# Patient Record
Sex: Female | Born: 1960 | Race: Black or African American | Hispanic: No | Smoking: Former smoker
Health system: Southern US, Community
[De-identification: ages and names within clinical notes are randomized; demographics above are authoritative.]

---

## 1998-03-10 ENCOUNTER — Encounter: Admission: RE | Admit: 1998-03-10 | Discharge: 1998-03-10 | Payer: Self-pay | Admitting: Family Medicine

## 1998-03-26 ENCOUNTER — Encounter: Admission: RE | Admit: 1998-03-26 | Discharge: 1998-03-26 | Payer: Self-pay | Admitting: Family Medicine

## 2001-02-07 ENCOUNTER — Encounter: Admission: RE | Admit: 2001-02-07 | Discharge: 2001-02-07 | Payer: Self-pay | Admitting: Family Medicine

## 2001-02-14 ENCOUNTER — Encounter: Admission: RE | Admit: 2001-02-14 | Discharge: 2001-02-14 | Payer: Self-pay | Admitting: Family Medicine

## 2001-04-12 ENCOUNTER — Encounter: Admission: RE | Admit: 2001-04-12 | Discharge: 2001-04-12 | Payer: Self-pay | Admitting: Family Medicine

## 2001-06-19 ENCOUNTER — Encounter: Admission: RE | Admit: 2001-06-19 | Discharge: 2001-06-19 | Payer: Self-pay | Admitting: Family Medicine

## 2002-03-21 ENCOUNTER — Encounter (INDEPENDENT_AMBULATORY_CARE_PROVIDER_SITE_OTHER): Payer: Self-pay | Admitting: *Deleted

## 2002-04-11 ENCOUNTER — Ambulatory Visit (HOSPITAL_COMMUNITY): Admission: RE | Admit: 2002-04-11 | Discharge: 2002-04-11 | Payer: Self-pay | Admitting: Obstetrics and Gynecology

## 2002-08-28 ENCOUNTER — Encounter: Admission: RE | Admit: 2002-08-28 | Discharge: 2002-08-28 | Payer: Self-pay | Admitting: Sports Medicine

## 2002-10-03 ENCOUNTER — Encounter: Admission: RE | Admit: 2002-10-03 | Discharge: 2002-10-03 | Payer: Self-pay | Admitting: Family Medicine

## 2002-10-25 ENCOUNTER — Inpatient Hospital Stay (HOSPITAL_COMMUNITY): Admission: EM | Admit: 2002-10-25 | Discharge: 2002-10-28 | Payer: Self-pay | Admitting: Emergency Medicine

## 2002-10-25 ENCOUNTER — Encounter: Payer: Self-pay | Admitting: Emergency Medicine

## 2005-09-20 ENCOUNTER — Ambulatory Visit: Payer: Self-pay | Admitting: Family Medicine

## 2006-10-18 DIAGNOSIS — L2089 Other atopic dermatitis: Secondary | ICD-10-CM

## 2006-10-19 ENCOUNTER — Encounter (INDEPENDENT_AMBULATORY_CARE_PROVIDER_SITE_OTHER): Payer: Self-pay | Admitting: *Deleted

## 2006-10-23 ENCOUNTER — Ambulatory Visit: Payer: Self-pay | Admitting: Family Medicine

## 2006-10-23 ENCOUNTER — Encounter (INDEPENDENT_AMBULATORY_CARE_PROVIDER_SITE_OTHER): Payer: Self-pay | Admitting: Family Medicine

## 2006-10-23 DIAGNOSIS — I252 Old myocardial infarction: Secondary | ICD-10-CM | POA: Insufficient documentation

## 2006-10-23 DIAGNOSIS — F17200 Nicotine dependence, unspecified, uncomplicated: Secondary | ICD-10-CM

## 2006-10-23 DIAGNOSIS — N924 Excessive bleeding in the premenopausal period: Secondary | ICD-10-CM

## 2006-10-23 LAB — CONVERTED CEMR LAB: TSH: 2.39 microintl units/mL (ref 0.350–5.50)

## 2006-10-25 ENCOUNTER — Ambulatory Visit (HOSPITAL_COMMUNITY): Admission: RE | Admit: 2006-10-25 | Discharge: 2006-10-25 | Payer: Self-pay | Admitting: Family Medicine

## 2006-11-09 ENCOUNTER — Encounter (INDEPENDENT_AMBULATORY_CARE_PROVIDER_SITE_OTHER): Payer: Self-pay | Admitting: Family Medicine

## 2006-11-28 ENCOUNTER — Ambulatory Visit: Payer: Self-pay | Admitting: Family Medicine

## 2006-11-28 DIAGNOSIS — R634 Abnormal weight loss: Secondary | ICD-10-CM

## 2006-11-29 ENCOUNTER — Encounter (INDEPENDENT_AMBULATORY_CARE_PROVIDER_SITE_OTHER): Payer: Self-pay | Admitting: Family Medicine

## 2009-11-03 ENCOUNTER — Ambulatory Visit: Payer: Self-pay | Admitting: Family Medicine

## 2009-11-03 DIAGNOSIS — N63 Unspecified lump in unspecified breast: Secondary | ICD-10-CM

## 2009-11-10 ENCOUNTER — Telehealth: Payer: Self-pay | Admitting: *Deleted

## 2009-11-11 ENCOUNTER — Encounter: Admission: RE | Admit: 2009-11-11 | Discharge: 2009-11-11 | Payer: Self-pay | Admitting: Family Medicine

## 2009-11-11 ENCOUNTER — Encounter: Payer: Self-pay | Admitting: *Deleted

## 2009-11-11 ENCOUNTER — Encounter: Payer: Self-pay | Admitting: Family Medicine

## 2009-12-30 ENCOUNTER — Encounter: Admission: RE | Admit: 2009-12-30 | Discharge: 2009-12-30 | Payer: Self-pay | Admitting: Family Medicine

## 2010-08-16 ENCOUNTER — Ambulatory Visit
Admission: RE | Admit: 2010-08-16 | Discharge: 2010-08-16 | Payer: Self-pay | Source: Home / Self Care | Attending: Family Medicine | Admitting: Family Medicine

## 2010-08-16 DIAGNOSIS — IMO0002 Reserved for concepts with insufficient information to code with codable children: Secondary | ICD-10-CM

## 2010-09-05 ENCOUNTER — Telehealth: Payer: Self-pay | Admitting: *Deleted

## 2010-09-06 ENCOUNTER — Telehealth: Payer: Self-pay | Admitting: *Deleted

## 2010-09-11 ENCOUNTER — Encounter: Payer: Self-pay | Admitting: Family Medicine

## 2010-09-20 NOTE — Progress Notes (Signed)
Summary: pt needs ov/ts  Phone Note Call from Patient   Caller: Patient Summary of Call: had to cancel appt for today b/c she had to go into work. Initial call taken by: De Nurse,  November 10, 2009 9:00 AM  Follow-up for Phone Call        thanks.  she needs to reschedule.  we were supposed to schedule ultrasound/mammogram for her.  do you know what happened with this?  looks like she had appt 3/17 but didn't show?? Follow-up by: Eustaquio Boyden  MD,  November 10, 2009 11:57 AM  Additional Follow-up for Phone Call Additional follow up Details #1::        called pt unable to leave message will try again later. Additional Follow-up by: Tessie Fass CMA,  November 10, 2009 12:45 PM    Additional Follow-up for Phone Call Additional follow up Details #2::    attempted to call pt again. no answer. unable to leave message. Follow-up by: Tessie Fass CMA,  November 10, 2009 5:17 PM  Additional Follow-up for Phone Call Additional follow up Details #3:: Details for Additional Follow-up Action Taken: letter mailed. Additional Follow-up by: Arlyss Repress CMA,,  November 11, 2009 11:16 AM

## 2010-09-20 NOTE — Miscellaneous (Signed)
Summary: re-peat Breast US/TS  Clinical Lists Changes unable to reach pt by phone. mailed letter with [facility] order faxed. APPT 12-02-09 AT 10 AM..Arlyss Repress CMA,  November 11, 2009 3:36 PM  Appended Document: re-peat Breast US/TS mailed letter with appt information to pt.

## 2010-09-20 NOTE — Assessment & Plan Note (Signed)
Summary: breast tenderness/eo   Vital Signs:  Patient profile:   50 year old female Height:      60 inches Weight:      117 pounds BMI:     22.93 Temp:     98.2 degrees F oral Pulse rate:   100 / minute BP sitting:   118 / 78  (left arm) Cuff size:   regular  Vitals Entered By: Tessie Fass CMA (November 03, 2009 11:23 AM) CC: left breast tenderness and swelling x 10 days Is Patient Diabetic? No Pain Assessment Patient in pain? yes     Location: left breast Intensity: 8   Primary Care Provider:  Valla Leaver MD  CC:  left breast tenderness and swelling x 10 days.  History of Present Illness: CC: breast lump  Noticed nipple lump and soreness 10 days ago.  Started with LMP 10/23/2009.  Has swollen.  Very sore.  Hasn't tried anything for it yet.  Not draining.  First time had something like this.  No redness or warmth around nipple.  Normally has regular periods, pretty normal bleeding.  Still has ovaries/uterus.  BTL 1996, mild MI 2005.  No family h/o breast cancer.  + colon cancer  wants to quit smoking cold [place].  Habits & Providers  Alcohol-Tobacco-Diet     Alcohol drinks/day: <1     Tobacco Status: current     Cigarette Packs/Day: 1.0  Exercise-Depression-Behavior     Drug Use: never     Seat Belt Use: always  Current Medications (verified): 1)  None  Allergies (verified): No Known Drug Allergies  Past History:  Past Medical History: [id number] (all NSVD) no h/o abn paps BTL 2005ish h/o cardiac cath for acute inferior wall MI 10/2002 Myocardial infarction, hx of 2004 heavy menses; multiple fibroids per uterine u/s 09/2006  Family History: father - healthy, colon ca mom - hyperthyroidism, DM sister - hypothyroidism sister - colon ca No breast CA, MI, CVA  Social History: Packs/Day:  1.0 Drug Use:  never 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  left breast nipple swollen, tender, erythematous around areola.  No discharge.  No palpable axillary, supracervical or mammary chain LNs.     Impression & Recommendations:  Problem # 1:  BREAST MASS, LEFT (ICD-611.72)  left nipple mass.  warm compresses.  diagnostic breast ultrasound/mammogram as last was 4 years ago.  mastitis vs malignant.  given rapidity of onset (10 days), more likely infection.  treat with doxycycline to cover MRSA.  RTC 1 wk  Orders: Mammogram (Diagnostic) (Mammo) [facility]- Est  Level 4 (71696)  Problem # 2:  TOBACCO USE (ICD-305.1)  discuss cessation (wants nerve pills) next week.  Orders: [facility]- Est  Level 4 (78938)  Complete Medication List: 1)  Doxycycline Hyclate 100 Mg Caps (Doxycycline hyclate) .... Take one by mouth two times a day x 10 days  Patient Instructions: 1)  Please return next week for follow up. 2)  We will send you to get a mammogram/ultrasound of your left breast.  If you haven't heard from [place] by the friday, call us. 3)  It likely is just an inflammation/infection of the left nipple so use warm compresses daily. 4)  antibiotic 5)  Call clinic with questions.  Pleasure to meet you today! Prescriptions: DOXYCYCLINE HYCLATE 100 MG CAPS (DOXYCYCLINE HYCLATE) take one by mouth two times a day x 10  days  #20 x 0   Entered and Authorized by:   Eustaquio Boyden  MD   Signed by:   Eustaquio Boyden  MD on 11/03/2009   Method used:   Electronically to        [facility] [phone]* (retail)       [address]       [place], [place]  [address]       Ph: [phone]       Fax: [phone]   RxID:   [id number]

## 2010-09-20 NOTE — Letter (Signed)
Summary: Generic Letter  [facility]  [address]   [place], [place] [address]   Phone: [phone]  Fax: [phone]    11/11/2009  CHAQUETTA SCHLOTTMAN [address] [place], [place]  [address]  Dear Ms. Carswell,   We were unable to reach you by phone. Please call us and schedule office visit with Dr.Gutierrez.        Sincerely,   Arlyss Repress CMA,

## 2010-09-22 NOTE — Progress Notes (Signed)
Summary: phn msg  Phone Note Call from Patient Call back at [phone]   Caller: Patient Summary of Call: pt is asking to speak to [facility] about pain meds, Initial call taken by: De Nurse,  September 06, 2010 2:47 PM  Follow-up for Phone Call        called pt, told her she needs to schedule ov with Dr Ashley Royalty before he can write her Rx for pain meds. Has not been seen by Ashley Royalty who is her primary MD. Pt agreed Follow-up by: Tessie Fass CMA,  September 06, 2010 4:39 PM

## 2010-09-22 NOTE — Assessment & Plan Note (Signed)
Summary: left back pain   Vital Signs:  Patient profile:   50 year old female Height:      60 inches Weight:      114.4 pounds BMI:     22.42 Temp:     98.7 degrees F oral Pulse rate:   88 / minute BP sitting:   110 / 78  (left arm) Cuff size:   regular  Vitals Entered By: Garen Grams LPN (August 16, 2010 8:53 AM) CC: pain in back and left leg x 4 days Is Patient Diabetic? No Pain Assessment Patient in pain? yes     Location: back/leg Intensity: 8   Primary Care Provider:  Valla Leaver MD  CC:  pain in back and left leg x 4 days.  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  and felt spasms in lower back and then the spasms moved down to left front of leg.  no relief with asa, heat,  a muscle relaxer given by niece, pain is made worse with sitting or lying still, relief with movement and changing postion.  tingling in front of left leg.  no fever. no urinary retention or frequency.  no consipation or diarrhea.  no saddle anesthesia.  Habits & Providers  Alcohol-Tobacco-Diet     Alcohol drinks/day: <1     Tobacco Status: current     Tobacco Counseling: to quit use of tobacco products     Cigarette Packs/Day: 0.5  Current Medications (verified): 1)  Tramadol Hcl 50 Mg Tabs (Tramadol Hcl) .... Take Three Times A Day As Needed  Allergies (verified): No Known Drug Allergies  Social History: Packs/Day:  0.5  Review of Systems       as per hpi  Physical Exam  General:  VSS Well-developed,well-nourished,in no acute distress; alert,appropriate and cooperative throughout examination Lungs:  Normal respiratory effort, chest expands symmetrically. Lungs are clear to auscultation, no crackles or wheezes. Msk:  normal gait. + tenderness with palpation of left gluteal area.  Neg straight leg  raise bilateral. strength equal bilateral in upper and lower ext.  reflexes equal bilater in all ext.  sensation grossly intact bilateral.  Skin:  Intact without suspicious lesions or rashes   Impression & Recommendations:  Problem # 1:  MUSCLE STRAIN, LEFT BUTTOCK (ICD-848.8)  will treat with scheduled tylenol for antiinflammatory and with tramadol for breakthrough pain.  reviewed red flags for return with pt.  pt states understanding.  pt is to return if no improvement or if new or worsening of symptoms.   Orders: [facility]- Est Level  3 ([address])  Complete Medication List: 1)  Tramadol Hcl 50 Mg Tabs (Tramadol hcl) .... Take three times a day as needed  Patient Instructions: 1)  tylenol (acetaminophen) 650mg  two times a day 2)  Take tramadol take 1 tablet three times a day as needed for pain.  3)  return if any red flags occur as we discussed  4)  return as needed. 5)  make an appt for yearly physical with Dr. Ashley Royalty. Prescriptions: TRAMADOL HCL 50 MG TABS (TRAMADOL HCL) take three times a day as needed  #25 x 0   Entered and Authorized by:   Ellin Mayhew MD   Signed by:   Ellin Mayhew MD on 08/16/2010   Method used:   Electronically to  [facility]  [address] [phone]* (retail)       [address]       [place], [place]  [address]       Ph: [phone]       Fax: [phone]   RxID:   [id number]    Orders Added: 1)  [facility]- Est Level  3 [44010]

## 2010-09-22 NOTE — Progress Notes (Signed)
  Phone Note Call from Patient   Caller: Patient Call For: [phone] Summary of Call: Pt was seen 12/27 as work for back & leg pain.  Pain meds given for condition, no improving.  Now having severe leg cramps.  Patient requesting meds for muscle spasms called to [facility] on [address]. Initial call taken by: Abundio Miu,  September 05, 2010 10:00 AM  Follow-up for Phone Call        called pt lmvm to return call. needs to schedule appt. Follow-up by: Tessie Fass CMA,  September 05, 2010 10:13 AM  Additional Follow-up for Phone Call Additional follow up Details #1::        spoke with pt, says she is in pain and would like something called in. says she has no insurance and cannot afford to come in for office visit. was seen for this problem by Dr Edmonia James 08/16/10. forwarded to Dr Ashley Royalty. Additional Follow-up by: Tessie Fass CMA,  September 05, 2010 10:52 AM    Additional Follow-up for Phone Call Additional follow up Details #2::    pt called back, says she is in alot of pain and will either call in the morning for a work in appt or come as a walk in. Follow-up by: Tessie Fass CMA,  September 05, 2010 4:15 PM

## 2010-09-26 ENCOUNTER — Encounter: Payer: Self-pay | Admitting: *Deleted

## 2011-01-06 NOTE — Discharge Summary (Signed)
NAMEMarland Kitchen  Katie Stephens, Katie Stephens                           ACCOUNT NO.:  [id number]   MEDICAL RECORD NO.:  [id number]                   PATIENT TYPE:  INP   LOCATION:  2902                                 FACILITY:  [facility]   PHYSICIAN:  Eduardo Osier. Sharyn Lull, M.D.              DATE OF BIRTH:  [birth year]   DATE OF ADMISSION:  10/25/2002  DATE OF DISCHARGE:  10/28/2002                                 DISCHARGE SUMMARY   ADMITTING DIAGNOSES:  1. Acute inferoposterior wall myocardial infarction.  2. Tobacco abuse.  3. Marijuana abuse.   FINAL DIAGNOSES:  1. Status post acute inferior wall myocardial infarction.  2. Tobacco abuse.  3. Marijuana abuse.   DISCHARGE HOME MEDICATIONS:  1. Enteric-coated aspirin 325 mg one tablet daily.  2. Plavix 75 mg one tablet daily with food.  3. Toprol-XL 25 mg a half tablet daily.  4. Nitrostat 0.4 mg sublingual -- use as directed.   ACTIVITY:  Avoid heavy lifting, pushing or pulling.   DIET:  Low salt, low cholesterol.   DISCHARGE INSTRUCTIONS:  Post-cardiac-catheterization instructions have been  given.   FOLLOWUP:  Follow up with me in one week.   CONDITION AT DISCHARGE:  Condition at discharge is stable.   BRIEF HISTORY:  The patient is a 50 year old black female with a past  medical history significant for tobacco abuse and marijuana abuse.  She came  to the ER via EMS complaining of retrosternal chest pressure which woke her  up about 7 a.m., grade 10/10, associated with numbness in both arms and  nausea, vomiting and diaphoresis.  In the ER, the patient received two  sublingual nitroglycerins with partial relief of chest pain from 10/10 to  2/10.  EKG done in the ER showed sinus bradycardia with first-degree A-V  block, 1-mm ST elevation in II, III and aVF and minor ST depression in V1 to  V3 and less focal changes in I and aVL suggestive of inferoposterior injury  pattern.   PAST MEDICAL HISTORY:  None.   PAST SURGICAL HISTORY:  She had a  tubal ligation in the past.   MEDICATIONS:  None.   ALLERGIES:  None.   SOCIAL HISTORY:  She smokes a half a pack per day for 30+ years, smokes  occasionally marijuana and denies any cocaine abuse.  She works as a  Engineer, materials.   FAMILY HISTORY:  Family history is negative for coronary artery disease.   PHYSICAL EXAMINATION:  GENERAL:  On examination, she was alert, awake,  oriented x 3, in no acute distress.  VITAL SIGNS:  Blood pressure was 94/60.  Pulse was 58 and sinus bradycardia  on the monitor.  HEENT:  Conjunctivae were pink.  NECK:  Neck supple.  No JVD.  No bruit.  LUNGS:  Lungs were clear to auscultation without rhonchi or rales.  CARDIOVASCULAR:  S1 and S2 were normal.  There was no S3 gallop  or murmur.  ABDOMEN:  Abdomen was soft.  Bowel sounds were present and nontender.  EXTREMITIES:  There was no clubbing, cyanosis or edema.   LABORATORY AND ACCESSORY CLINICAL DATA:  Chest x-ray showed no acute  disease.   Her cholesterol was 145, triglycerides 78, HDL 80, LDL 49.  Cardiac enzymes:  First set -- CK was 83, MB 1.8; second set -- CK 782, MB 97, relative index  12.4; third set -- CK 964, MB 118, relative index 12.3; last set -- CK today  is 179, MB of 3.2.  Troponin I:  First set was 0.03, second set 12.4, third  set 12.3, fourth set 3.31. Her hemoglobin was 13.2, hematocrit 39.5, white  count of 10.1.  Sodium was 143, potassium 3.2, chloride 110, bicarb 25,  blood sugar was 119, repeat blood sugar was 98, BUN was 9, creatinine 0.8.  Her potassium repeat was 4.1 on October 26, 2002.   BRIEF HOSPITAL COURSE:  The patient was identified and taken to the cath lab  and underwent left cardiac cath with selective left and right coronary  angiography as per procedure report.  The patient tolerated the procedure  well.  There were no complications.  The patient was chest-pain-free after  the procedure.  The patient's distal RCA was occluded, which was a very  small  vessel, and was felt to be not suitable for percutaneous intervention.  The patient did not have any episodes of further chest pain during the  hospital stay.  Her groin is stable.  Phase I cardiac rehab was called.  The  patient has been ambulating in the hallway without any problems.  The  patient will be discharged home on the above medications and will be  followed up in my office in one week.                                               Eduardo Osier. Sharyn Lull, M.D.    MNH/MEDQ  D:  10/28/2002  T:  10/29/2002  Job:  [id number]

## 2011-01-06 NOTE — Cardiovascular Report (Signed)
NAME:  Katie Stephens, Katie Stephens                           ACCOUNT NO.:  [id number]   MEDICAL RECORD NO.:  [id number]                   PATIENT TYPE:  INP   LOCATION:  1824                                 FACILITY:  [facility]   PHYSICIAN:  Mohan N. Sharyn Lull, M.D.              DATE OF BIRTH:  1961-08-03   DATE OF PROCEDURE:  10/25/2002  DATE OF DISCHARGE:                              CARDIAC CATHETERIZATION   PROCEDURE:  1. Left cardiac catheterization with selective left and right coronary     angiography.  2. Left ventricular grafting via the right groin using Judkins technique.   INDICATIONS FOR PROCEDURE:  The patient is a 50 year old black female with  no significant past medical history except for tobacco abuse and marijuana  abuse.  She came to the ER via EMS complaining of retrosternal chest  pressure which woke her up around 7 a.m., grade 10/10 associated with  numbness in both arms, nausea, vomiting and diaphoresis.  In the ER, the  patient received two sublingual nitroglycerin with partial relief of chest  pain from 10/10 to 2/10.  EKG done in the ER showed sinus bradycardia with  first-degree AV block with 1 mm ST elevation in 2, 3, aVF and minor ST-  depression in V1 to V3, and reciprocal changes in 1 and aVL suggestive of an  anteroposterior injury pattern.  The patient denies such episodes of chest  pain during the past.  Denies any cocaine abuse.   PAST MEDICAL HISTORY:  She has the above past surgical history.  She had  tubal ligation in the past.   MEDICATIONS:  None.   ALLERGIES:  None.   SOCIAL HISTORY:  She smokes one-half pack per day for 20+ years.  She smokes  occasionally marijuana.  Denies any cocaine abuse.  She works as a Administrator, [employer].   FAMILY HISTORY:  Negative for coronary artery disease.   PHYSICAL EXAMINATION:  On examination, she was alert, awake, oriented x3, in  no acute distress.  Blood pressure was 94/60.  Pulse was 58.  Sinus  bradycardia on the  monitor.  Conjunctivae were pink.  Neck supple.  No JVD,  no bruits.  Lungs are clear to auscultation without rhonchi or rales.  Cardiovascular exam S1, S2 was normal.  There was no S3 gallop or murmur.  Abdomen is soft, bowel sounds are present, nontender.  Extremities:  There is no clubbing, cyanosis or edema.   DISCUSSION:  I discussed with the patient regarding emergency left  catheterization and possible  PTCA and stenting.  The risks were described  as MI, stroke requiring emergency CABG, risks of restenosis, local vascular  complications, etc., and consented for PCI.   PROCEDURE:  After obtaining the informed consent, the patient was brought to  the Catheterization Lab and was placed on the fluoroscopy table.  The right  groin was prepped and draped in the usual fashion.  2% Xylocaine was used  for local anesthesia in the right groin.  With the help of a thin-walled  needle, 7-French arterial and 6-French venous sheaths were placed.  Both the  sheaths were aspirated and flushed.  Next, a 6-French left Judkins catheter  was advanced over the wire under fluoroscopic guidance into the ascending  aorta.  The wire was pulled out.  The catheter was aspirated and connected  to the manifold.  The catheter was further advanced and engaged into the  left coronary ostium.  Multiple views of the left system were taken.  Next,  the catheter was disengaged and was pulled out over the wire, and was  replaced with a 6-French right Judkins catheter which was advanced over the  wire under fluoroscopic guidance, up to the ascending aorta.  The wire was  pulled out.  The catheter was aspirated and connected to the manifold.  The  catheter was further advanced and engaged into the right coronary ostium.  Multiple views of the right system were taken.  Next, the catheter was  disengaged and was pulled out over the wire, and was replaced with a 6-  French pigtail catheter which was advanced over the wire  under fluoroscopic  guidance up to the ascending aorta.  The wire was pulled out.  The catheter  was aspirated and connected to the manifold.  The catheter was further  advanced across the aortic valve into the LV.  LV pressures were recorded.  Next, LV graph was done in 30 degree RAO position.  Post angiographic  pressures were recorded from LV and then pull back pressures were recorded  from the aorta.  There was no gradient across the aortic valve.   Next, the pigtail catheter was pulled out over the wire.  The sheaths were  aspirated and flushed.   FINDINGS:  1. Left ventricle showed mild inferobasal wall hypokinesia, EF of 50-55%.  2. The left main was patent.  3. The left anterior descending was patent.  4. Diagonal 1 and 2 were small which were patent.  5. The left circumflex was patent which tapers down in the AV groove after     giving off obtuse marginal  2.  The obtuse marginal 1 was small which was     patent.  Obtuse marginal  2 was medium size which was patent.  The right     coronary artery was patent in proximal and mid portion distally after     giving off posterior descending artery.  The ostium was 100% occluded.     Distal vessel appears to be 1 mm in size and surprisingly small area of     myocardium.  6. The patient presently is chest-pain free.  There is no evidence of any     cardiac arrhythmias on the monitor.  The patient's blood pressure has     improved.  The patient will be treated medically.  Will start 2B3     inhibitors and continue on aspirin and Plavix.  We will start the beta-     blockers once her heart rate and blood pressure improves.  We will add     ACE inhibitors as blood pressure  tolerates.                                               Eduardo Osier.  Sharyn Lull, M.D.    MNH/MEDQ  D:  10/25/2002  T:  10/26/2002  Job:  [id number]   cc:   Cath Lab   Eduardo Osier. Sharyn Lull, M.D.  [address]  [place]  [place] [address] Fax: [phone]   Osvaldo Shipper.  Spruill, M.D.  [address] [address]  [employer] [employer]  [place] [address]  Fax: [phone]

## 2012-02-01 ENCOUNTER — Ambulatory Visit: Payer: Self-pay | Admitting: Family Medicine

## 2012-02-06 ENCOUNTER — Encounter: Payer: Self-pay | Admitting: Family Medicine

## 2012-02-06 ENCOUNTER — Ambulatory Visit (INDEPENDENT_AMBULATORY_CARE_PROVIDER_SITE_OTHER): Payer: Self-pay | Admitting: Family Medicine

## 2012-02-06 ENCOUNTER — Other Ambulatory Visit (HOSPITAL_COMMUNITY)
Admission: RE | Admit: 2012-02-06 | Discharge: 2012-02-06 | Disposition: A | Discharge status: Home / Self Care | Payer: Self-pay | Source: Ambulatory Visit | Attending: Family Medicine | Admitting: Family Medicine

## 2012-02-06 VITALS — BP 112/79 | HR 92 | Temp 98.2°F | Ht 60.0 in | Wt 122.0 lb

## 2012-02-06 DIAGNOSIS — Z113 Encounter for screening for infections with a predominantly sexual mode of transmission: Secondary | ICD-10-CM | POA: Insufficient documentation

## 2012-02-06 DIAGNOSIS — N898 Other specified noninflammatory disorders of vagina: Secondary | ICD-10-CM

## 2012-02-06 LAB — POCT WET PREP (WET MOUNT)

## 2012-02-06 MED ORDER — METRONIDAZOLE 500 MG PO TABS: 500.0000 mg | ORAL_TABLET | Freq: Two times a day (BID) | ORAL | Status: AC

## 2012-02-06 NOTE — Progress Notes (Signed)
Katie Stephens is a 51 y.o. female who presents to [facility] today for discharge for one month. Patient notes foul-smelling vaginal discharge without pain dysuria frequency urgency for one month. She has tried Monistat 7 which has not been effective. She had BV once perhaps.  She denies any new sexual contacts and feels well otherwise.   PMH: Reviewed otherwise healthy History  Substance Use Topics  . Smoking status: Current Some Day Smoker  . Smokeless tobacco: Not on file  . Alcohol Use: Not on file   ROS as above  Medications reviewed. Current Outpatient Prescriptions  Medication Sig Dispense Refill  . traMADol (ULTRAM) 50 MG tablet Take three times a day as needed         Exam:  BP 112/79  Pulse 92  Temp 98.2 F (36.8 C) (Oral)  Ht 5' (1.524 m)  Wt 122 lb (55.339 kg)  BMI 23.83 kg/m2 Gen: Well NAD GYN: Normal external genitalia. Vaginal canal with white discharge. Cervix is in the patient's left side and upper position but otherwise normal-appearing. Nontender   Results for orders placed in visit on 02/06/12 (from the past 72 hour(s))  POCT WET PREP (WET MOUNT)     Status: Abnormal   Collection Time   02/06/12  8:51 AM      Component Value Range Comment   Source Wet Prep POC VAG      WBC, Wet Prep HPF POC 10-20      Bacteria Wet Prep HPF POC 3+ COCCI      Clue Cells Wet Prep HPF POC Moderate      CLUE CELLS WET PREP WHIFF POC Positive Whiff      Yeast Wet Prep HPF POC None      Trichomonas Wet Prep HPF POC LOADED

## 2012-02-06 NOTE — Assessment & Plan Note (Signed)
Positive for Trichomonas and bacterial vaginosis. Plan to treat with flagyl. Also advise making sure her partner gets treated and discussed safe sex techniques. Warned about drinking while on Flagyl. Patient expresses understanding

## 2012-02-19 ENCOUNTER — Ambulatory Visit (INDEPENDENT_AMBULATORY_CARE_PROVIDER_SITE_OTHER): Payer: Self-pay | Admitting: Family Medicine

## 2012-02-19 ENCOUNTER — Encounter: Payer: Self-pay | Admitting: Family Medicine

## 2012-02-19 VITALS — BP 102/67 | HR 76 | Temp 98.5°F | Ht 60.0 in | Wt 124.0 lb

## 2012-02-19 DIAGNOSIS — N760 Acute vaginitis: Secondary | ICD-10-CM

## 2012-02-19 DIAGNOSIS — N898 Other specified noninflammatory disorders of vagina: Secondary | ICD-10-CM

## 2012-02-19 LAB — POCT WET PREP (WET MOUNT): Clue Cells Wet Prep Whiff POC: POSITIVE

## 2012-02-19 MED ORDER — TINIDAZOLE 500 MG PO TABS: 2.0000 g | ORAL_TABLET | Freq: Every day | ORAL | Status: AC

## 2012-02-19 NOTE — Progress Notes (Signed)
  Subjective:    Patient ID: Katie Stephens, female    DOB: [birth year], 51 y.o.   MRN: [id number]  HPI  Here today for recurrent vaginal odor.  Not much discharge.  She was treated for BV and trich last month.  Her partner was treated also.  Did douche recently secondary to odor.  Review of Systems  Constitutional: Negative for fever and chills.  HENT: Negative for nosebleeds, congestion and rhinorrhea.   Eyes: Negative for visual disturbance.  Respiratory: Negative for chest tightness and shortness of breath.   Cardiovascular: Negative for chest pain.  Gastrointestinal: Negative for nausea, vomiting, abdominal pain, diarrhea, constipation, blood in stool and abdominal distention.  Genitourinary: Negative for dysuria, frequency, vaginal bleeding and menstrual problem.  Musculoskeletal: Negative for arthralgias.  Skin: Negative for rash.  Neurological: Negative for dizziness and headaches.  Psychiatric/Behavioral: Negative for behavioral problems, disturbed wake/sleep cycle and dysphoric mood.  All other systems reviewed and are negative.       Objective:   Physical Exam  Vitals reviewed. Constitutional: She appears well-developed and well-nourished.  HENT:  Head: Normocephalic and atraumatic.  Eyes: No scleral icterus.  Neck: Neck supple.  Cardiovascular: Normal rate and regular rhythm.   Abdominal: Soft. There is no tenderness.  Skin: Skin is warm and dry.          Assessment & Plan:

## 2012-02-19 NOTE — Patient Instructions (Addendum)
Bacterial Vaginosis Bacterial vaginosis (BV) is a vaginal infection where the normal balance of bacteria in the vagina is disrupted. The normal balance is then replaced by an overgrowth of certain bacteria. There are several different kinds of bacteria that can cause BV. BV is the most common vaginal infection in women of childbearing age. CAUSES   The cause of BV is not fully understood. BV develops when there is an increase or imbalance of harmful bacteria.   Some activities or behaviors can upset the normal balance of bacteria in the vagina and put women at increased risk including:   Having a new sex partner or multiple sex partners.   Douching.   Using an intrauterine device (IUD) for contraception.   It is not clear what role sexual activity plays in the development of BV. However, women that have never had sexual intercourse are rarely infected with BV.  Women do not get BV from toilet seats, bedding, swimming pools or from touching objects around them.  SYMPTOMS   Grey vaginal discharge.   A fish-like odor with discharge, especially after sexual intercourse.   Itching or burning of the vagina and vulva.   Burning or pain with urination.   Some women have no signs or symptoms at all.  DIAGNOSIS  Your caregiver must examine the vagina for signs of BV. Your caregiver will perform lab tests and look at the sample of vaginal fluid through a microscope. They will look for bacteria and abnormal cells (clue cells), a pH test higher than 4.5, and a positive amine test all associated with BV.  RISKS AND COMPLICATIONS   Pelvic inflammatory disease (PID).   Infections following gynecology surgery.   Developing HIV.   Developing herpes virus.  TREATMENT  Sometimes BV will clear up without treatment. However, all women with symptoms of BV should be treated to avoid complications, especially if gynecology surgery is planned. Female partners generally do not need to be treated. However,  BV may spread between female sex partners so treatment is helpful in preventing a recurrence of BV.   BV may be treated with antibiotics. The antibiotics come in either pill or vaginal cream forms. Either can be used with nonpregnant or pregnant women, but the recommended dosages differ. These antibiotics are not harmful to the baby.   BV can recur after treatment. If this happens, a second round of antibiotics will often be prescribed.   Treatment is important for pregnant women. If not treated, BV can cause a premature delivery, especially for a pregnant woman who had a premature birth in the past. All pregnant women who have symptoms of BV should be checked and treated.   For chronic reoccurrence of BV, treatment with a type of prescribed gel vaginally twice a week is helpful.  HOME CARE INSTRUCTIONS   Finish all medication as directed by your caregiver.   Do not have sex until treatment is completed.   Tell your sexual partner that you have a vaginal infection. They should see their caregiver and be treated if they have problems, such as a mild rash or itching.   Practice safe sex. Use condoms. Only have 1 sex partner.  PREVENTION  Basic prevention steps can help reduce the risk of upsetting the natural balance of bacteria in the vagina and developing BV:  Do not have sexual intercourse (be abstinent).   Do not douche.   Use all of the medicine prescribed for treatment of BV, even if the signs and symptoms go away.     Tell your sex partner if you have BV. That way, they can be treated, if needed, to prevent reoccurrence.  SEEK MEDICAL CARE IF:   Your symptoms are not improving after 3 days of treatment.   You have increased discharge, pain, or fever.  MAKE SURE YOU:   Understand these instructions.   Will watch your condition.   Will get help right away if you are not doing well or get worse.  FOR MORE INFORMATION  Division of STD Prevention (DSTDP), Centers for Disease  Control and Prevention: www.cdc.gov/std American Social Health Association (ASHA): www.ashastd.org  Document Released: 08/07/2005 Document Revised: 07/27/2011 Document Reviewed: 01/28/2009 ExitCare Patient Information 2012 [employer]. 

## 2012-02-19 NOTE — Assessment & Plan Note (Signed)
Treat for recurrent BV with Tindamax.

## 2014-08-28 ENCOUNTER — Encounter: Payer: Self-pay | Admitting: Family Medicine

## 2014-08-28 ENCOUNTER — Ambulatory Visit (INDEPENDENT_AMBULATORY_CARE_PROVIDER_SITE_OTHER): Payer: Self-pay | Admitting: Family Medicine

## 2014-08-28 ENCOUNTER — Other Ambulatory Visit (HOSPITAL_COMMUNITY)
Admission: RE | Admit: 2014-08-28 | Discharge: 2014-08-28 | Disposition: A | Discharge status: Home / Self Care | Payer: Self-pay | Source: Ambulatory Visit | Attending: Family Medicine | Admitting: Family Medicine

## 2014-08-28 VITALS — BP 126/89 | HR 82 | Temp 98.2°F | Ht 60.0 in | Wt 134.0 lb

## 2014-08-28 DIAGNOSIS — Z113 Encounter for screening for infections with a predominantly sexual mode of transmission: Secondary | ICD-10-CM | POA: Insufficient documentation

## 2014-08-28 DIAGNOSIS — Z202 Contact with and (suspected) exposure to infections with a predominantly sexual mode of transmission: Secondary | ICD-10-CM | POA: Insufficient documentation

## 2014-08-28 LAB — POCT WET PREP (WET MOUNT): Clue Cells Wet Prep Whiff POC: NEGATIVE

## 2014-08-28 NOTE — Assessment & Plan Note (Addendum)
Currently asymptomatic, requesting STD check, high risk sexual behaviors with 1 female partner (?Chlamydia hx >1 year ago), prior +trichomonas 2013 (treated) - No recent HIV test  Plan: 1. Comprehensive STD testing today - GC/Chlamdyia swab, HIV / RPR blood tests - call with results once available 2. Wet prep - negative, (no trich, BV, or yeast) WBCs 10-20 3. Advised safe sex practices with condoms for STD prevention. 4. Recommend partner to get tested for STDs as planned within 1-2 days. Advised to call [facility] with his results so that we can treat or re-test Mrs. Applin if needed. 5. RTC 1-3 months to f/u re-establish with Dr. Casper HarrisonStreet

## 2014-08-28 NOTE — Patient Instructions (Addendum)
Dear Lillia Mountainianne Maclaren, Thank you for coming in to clinic today. It was good to see you!  1. I think it is a good idea to come in to get a complete STD check both for you and your partner. Always recommended to stay healthy. 2. Your wet prep is completely normal. No evidence of trichomonas. No bacterial vaginosis and No yeast. 3. We will have your other test results (HIV, Syphilis, and Gonorrhea/Chlamydia) in 1-3 days, probably early next week. We will call you with these results. Recommend practicing safe sex with condoms for best way to reduce STDs.  Recommend for your partner to go get tested for all STDs as well. If he has any positive test, please call our clinic and leave a message for your nurse and they will contact your doctor.  You are due for a Pap Smear and a Mammogram.  Please schedule a follow-up appointment with Dr. Casper HarrisonStreet in 1-3 months to follow-up and re-establish care.  If you have any other questions or concerns, please feel free to call the clinic to contact me. You may also schedule an earlier appointment if necessary.  However, if your symptoms get significantly worse, please go to the Emergency Department to seek immediate medical attention.  Saralyn PilarAlexander Lenice Koper, DO [facility]

## 2014-08-28 NOTE — Progress Notes (Signed)
   Subjective:    Patient ID: Katie MountainDianne Stephens, female    DOB: 09/03/1960, 54 y.o.   MRN: [id number]  Patient presents for a same day appointment.  HPI  STD CHECK / CONCERN FOR STD EXPOSURE: - Reports that she was [facility] and diagnosed with trichomonas + BV in 01/2012, treated with Metronidazole. [facility] 02/2012 for persistent symptoms, wet prep with negative trichomonas but +BV. - Currently reports that she would like an STD check-up. Denies any active symptoms at this time, but reports that her partner went to the doctor about 1 year ago and was told he had Chlamydia. She has not had any treatment since that time and no real symptoms. Partner plans to go to doctor to get tested after her visit today - Sexual history with 1 female partner x 1.5 years, active regular intercourse, intermittent condom use with multiple unprotected encounters - Denies any vaginal discharge, vaginal itching, dysuria, hematuria, fevers/chills, abdominal / pelvic pain  HM: - Due for Pap Smear - last 2008 - Due for Mammogram - (last 10/2009, BIRADS-3, probable subareolar breast abscess measuring 1.6 x 0.6 x 1.8 cm in size)  I have reviewed and updated the following as appropriate: allergies and current medications  Social Hx: - Active smoker  Review of Systems  See above HPI    Objective:   Physical Exam  BP 126/89 mmHg  Pulse 82  Temp(Src) 98.2 F (36.8 C) (Oral)  Ht 5' (1.524 m)  Wt 134 lb (60.782 kg)  BMI 26.17 kg/m2  Gen - well-appearing, NAD HEENT - oropharynx clear, MMM Skin - warm, dry, no rashes Neuro - awake, alert  Pelvic Exam: Normal external female genitalia. Vaginal canal without lesions. Poor visualization of entire cervix but limited view without lesions or bleeding. Significantly increased thin creamy discharge on exam. Bimanual exam without masses or cervical motion tenderness.     Assessment & Plan:   See specific A&P problem list for  details.

## 2014-08-29 LAB — RPR

## 2014-08-29 LAB — HIV ANTIBODY (ROUTINE TESTING W REFLEX): HIV: NONREACTIVE

## 2014-08-31 ENCOUNTER — Telehealth: Payer: Self-pay | Admitting: Family Medicine

## 2014-08-31 LAB — CERVICOVAGINAL ANCILLARY ONLY
CHLAMYDIA, DNA PROBE: NEGATIVE
Neisseria Gonorrhea: NEGATIVE

## 2014-08-31 NOTE — Telephone Encounter (Signed)
[facility] for [facility]. See below for results from that visit:  Blood Tests: HIV - Negative RPR (Syphilis) - Negative  Swab Test: Chlamydia: Negative Gonorrhea: Negative  I attempted to call patient with these results today, 08/31/14 around 4:30pm, left voicemail for patient stating that all results were negative, and if questions / wants more info to call back [facility] to get results.  Please share above results with patient if she calls back. Thank you.  Saralyn PilarAlexander Shayanna Thatch, DO [facility], PGY-2

## 2014-10-21 ENCOUNTER — Ambulatory Visit: Payer: Self-pay

## 2015-02-12 ENCOUNTER — Encounter: Payer: Self-pay | Admitting: *Deleted

## 2016-05-19 ENCOUNTER — Telehealth: Payer: Self-pay | Admitting: Family Medicine

## 2016-05-19 NOTE — Telephone Encounter (Signed)
I received a request from [facility] to obtain previous images from [facility]. There is a release for signed. Is there any way we can follow up on this as it determines what needs to be done in the future.  Thanks, Joanna Puffrystal S. Dorsey, MD [facility] Resident  05/19/2016, 1:51 PM

## 2016-05-19 NOTE — Telephone Encounter (Signed)
Patient has had any imaging done since 2012, is this what they want? If so I can fax over the report if you give me the fax number. But for actual images it doesn't look like we have those so patient would have to go pick those up from [facility] herself.

## 2016-05-22 NOTE — Telephone Encounter (Signed)
From looking at the request it looks like they want the actual images, not just a read.  If you could please let the patient know.  Thanks, Joanna Puffrystal S. Josselin Gaulin, MD [facility] Resident  05/22/2016, 12:23 PM

## 2016-05-25 NOTE — Telephone Encounter (Signed)
Left message on patient voicemail informing that she would need pick up images from [facility] and forward them to [facility] because we only have the reports not the actual images.

## 2016-06-05 ENCOUNTER — Encounter: Payer: Self-pay | Admitting: Family Medicine

## 2016-06-05 ENCOUNTER — Ambulatory Visit (INDEPENDENT_AMBULATORY_CARE_PROVIDER_SITE_OTHER): Payer: Self-pay | Admitting: Family Medicine

## 2016-06-05 VITALS — BP 90/63 | HR 72 | Temp 98.5°F | Wt 143.0 lb

## 2016-06-05 DIAGNOSIS — R232 Flushing: Secondary | ICD-10-CM

## 2016-06-05 DIAGNOSIS — R631 Polydipsia: Secondary | ICD-10-CM

## 2016-06-05 DIAGNOSIS — Z0000 Encounter for general adult medical examination without abnormal findings: Secondary | ICD-10-CM

## 2016-06-05 LAB — GLUCOSE, CAPILLARY: GLUCOSE-CAPILLARY: 83 mg/dL (ref 65–99)

## 2016-06-05 MED ORDER — VITAMIN E 45 MG (100 UNIT) PO CAPS: 400.0000 [IU] | ORAL_CAPSULE | Freq: Every day | ORAL | 1 refills | Status: DC

## 2016-06-05 MED ORDER — VITAMIN E 45 MG (100 UNIT) PO CAPS: 100.0000 [IU] | ORAL_CAPSULE | Freq: Every day | ORAL | 1 refills | Status: DC

## 2016-06-05 NOTE — Patient Instructions (Signed)
It was nice to meet you. I printed off a prescription for vitamin E, however you can get this over-the-counter without using the pharmacist. You can continue to take black cohosh as needed. Please contact the financial advisor said that you can work on getting the financial P You do for colonoscopy, given your family history of colon cancer, I recommend getting this done soon. I have touched base with our social worker, you should also work on getting a repeat mammogram given your previous findings.  Menopause Menopause is the normal time of life when menstrual periods stop completely. Menopause is complete when you have missed 12 consecutive menstrual periods. It usually occurs between the ages of 48 years and 55 years. Very rarely does a woman develop menopause before the age of 40 years. At menopause, your ovaries stop producing the female hormones estrogen and progesterone. This can cause undesirable symptoms and also affect your health. Sometimes the symptoms may occur 4-5 years before the menopause begins. There is no relationship between menopause and:  Oral contraceptives.  Number of children you had.  Race.  The age your menstrual periods started (menarche). Heavy smokers and very thin women may develop menopause earlier in life. CAUSES  The ovaries stop producing the female hormones estrogen and progesterone.  Other causes include:  Surgery to remove both ovaries.  The ovaries stop functioning for no known reason.  Tumors of the pituitary gland in the brain.  Medical disease that affects the ovaries and hormone production.  Radiation treatment to the abdomen or pelvis.  Chemotherapy that affects the ovaries. SYMPTOMS   Hot flashes.  Night sweats.  Decrease in sex drive.  Vaginal dryness and thinning of the vagina causing painful intercourse.  Dryness of the skin and developing wrinkles.  Headaches.  Tiredness.  Irritability.  Memory problems.  Weight  gain.  Bladder infections.  Hair growth of the face and chest.  Infertility. More serious symptoms include:  Loss of bone (osteoporosis) causing breaks (fractures).  Depression.  Hardening and narrowing of the arteries (atherosclerosis) causing heart attacks and strokes. DIAGNOSIS   When the menstrual periods have stopped for 12 straight months.  Physical exam.  Hormone studies of the blood. TREATMENT  There are many treatment choices and nearly as many questions about them. The decisions to treat or not to treat menopausal changes is an individual choice made with your health care provider. Your health care provider can discuss the treatments with you. Together, you can decide which treatment will work best for you. Your treatment choices may include:   Hormone therapy (estrogen and progesterone).  Non-hormonal medicines.  Treating the individual symptoms with medicine (for example antidepressants for depression).  Herbal medicines that may help specific symptoms.  Counseling by a psychiatrist or psychologist.  Group therapy.  Lifestyle changes including:  Eating healthy.  Regular exercise.  Limiting caffeine and alcohol.  Stress management and meditation.  No treatment. HOME CARE INSTRUCTIONS   Take the medicine your health care provider gives you as directed.  Get plenty of sleep and rest.  Exercise regularly.  Eat a diet that contains calcium (good for the bones) and soy products (acts like estrogen hormone).  Avoid alcoholic beverages.  Do not smoke.  If you have hot flashes, dress in layers.  Take supplements, calcium, and vitamin D to strengthen bones.  You can use over-the-counter lubricants or moisturizers for vaginal dryness.  Group therapy is sometimes very helpful.  Acupuncture may be helpful in some cases. SEEK MEDICAL  CARE IF:   You are not sure you are in menopause.  You are having menopausal symptoms and need advice and  treatment.  You are still having menstrual periods after age 16 years.  You have pain with intercourse.  Menopause is complete (no menstrual period for 12 months) and you develop vaginal bleeding.  You need a referral to a specialist (gynecologist, psychiatrist, or psychologist) for treatment. SEEK IMMEDIATE MEDICAL CARE IF:   You have severe depression.  You have excessive vaginal bleeding.  You fell and think you have a broken bone.  You have pain when you urinate.  You develop leg or chest pain.  You have a fast pounding heart beat (palpitations).  You have severe headaches.  You develop vision problems.  You feel a lump in your breast.  You have abdominal pain or severe indigestion.   This information is not intended to replace advice given to you by your health care provider. Make sure you discuss any questions you have with your health care provider.   Document Released: 10/28/2003 Document Revised: 04/09/2013 Document Reviewed: 03/06/2013 Elsevier Interactive Patient Education [employer]

## 2016-06-05 NOTE — Progress Notes (Signed)
Subjective: CC: hot flashes HPI: Patient is a 55 y.o. female with a past medical history of MI per EMR however pt denies, left breast mass, tobacco use presenting to clinic today for hot flashes.   Hot flashes: bad for 1.5 years, started 2 years ago. Notes that it is all the time. She never becomes soaked due to the sweats. They last 12-13 seconds. She is woken up by these every 1-2 hrs at night, typically 3x/night. They don't interfere with activites.  Intermittently she will have chills.  No cough. She didn't think Black Cohosh helped, however she only took it for 1 week, as she felt waiting for 2 weeks to see an effect was a waste of time and money. No other infectious symptoms such as rhinorrhea, nasal congestion, headaches, SOB, chest pain. She notes she intermittently has vaginal dryness but she tolerates this well. Notes intermittently she has a labile mood.   Breast issues: patient [facility] [place]. At that time they recommended further imaging was warranted. The patient has not set this up. She notes she wants to figure out where is the cheapest as she has no insurance. She's not incredibly worried as she only cares about things that could lead to cancer. She does not do regular breast exams and has not noted any abnormalities with nipple retraction or discharge.   Social History: current smoker   Health Maintenance: due for hep C screening, TDap, pap smear, colonoscopy (in fact pt tells me her father and sister had colon cancer), mammogram, and flu vaccine however wants to defer due to lack of the financial card at this time.   ROS: All other systems reviewed and are negative besides that noted in HPI  Past Medical History Patient Active Problem List   Diagnosis Date Noted  . Hot flashes 06/07/2016  . Health care maintenance 06/06/2016  . Possible exposure to STD 08/28/2014  . BREAST MASS, LEFT 11/03/2009  . TOBACCO USE  10/23/2006  . MYOCARDIAL INFARCTION, HX OF 10/23/2006  . MENORRHAGIA 10/23/2006  . ECZEMA, ATOPIC DERMATITIS 10/18/2006    Medications- reviewed and updated Current Outpatient Prescriptions  Medication Sig Dispense Refill  . vitamin E 100 UNIT capsule Take 1 capsule (100 Units total) by mouth daily. 90 capsule 1   No current facility-administered medications for this visit.     Objective: Office vital signs reviewed. BP 90/63   Pulse 72   Temp 98.5 F (36.9 C) (Oral)   Wt 143 lb (64.9 kg)   LMP 12/28/2011   SpO2 100%   BMI 27.93 kg/m    Physical Examination:  General: Awake, alert, well- nourished, NAD ENMT:  TMs intact, normal light reflex, no erythema, no bulging. Nasal turbinates moist. MMM, Oropharynx clear without erythema or tonsillar exudate/hypertrophy Eyes: Conjunctiva non-injected. PERRL.  Cardio: RRR, no m/r/g noted.  Pulm: No increased WOB.  CTAB, without wheezes, rhonchi or crackles noted.  MSK: Normal gait and station Skin: dry, intact, no rashes or lesions Declines breast exam.  Assessment/Plan: Health care maintenance Patient declines all interventions due to financial constraints.  Due for mammogram: We discussed that as the radiologist needed further imaging, their goal was to try to rule out breast cancer with the previous mammogram which they were not able to do.  I am touched a cyst with social work to see if we can find an inexpensive area for her to get a repeat mammogram done. I've advised her  to take all of her previous imaging center they can compare. Colonoscopy: The patient declines this right now. She notes she would like to have financial average before this happens. She also asked about that fecal occult cards. Given her history of colon cancer in 2 primary family members, I have strongly recommended her to get a colonoscopy sooner rather than later. The patient is also due for hepatitis C screening, Tdap, flu vaccine, and Pap smear which she  defers.  Hot flashes Currently, the patient's symptoms are mild as they do not interfere with activity.  Discussed possible management of hot flashes. Given her need for further evaluation of her mammogram, I would like to avoid hormone replacement. Advised the patient to continue with black cohosh, noting that it would take several weeks for this to kick in. If this does not work, the patient could try low-dose vitamin E. Advised her to avoid beta-carotene during this time given there was a history of MI noted on her EMR.    Orders Placed This Encounter  Procedures  . Glucose, capillary  . POCT glucose (manual entry)     Joanna Puff PGY-3, [facility]

## 2016-06-06 DIAGNOSIS — Z0000 Encounter for general adult medical examination without abnormal findings: Secondary | ICD-10-CM | POA: Insufficient documentation

## 2016-06-06 NOTE — Assessment & Plan Note (Addendum)
Patient declines all interventions due to financial constraints.  Due for mammogram: We discussed that as the radiologist needed further imaging, their goal was to try to rule out breast cancer with the previous mammogram which they were not able to do.  I am touched a cyst with social work to see if we can find an inexpensive area for her to get a repeat mammogram done. I've advised her to take all of her previous imaging center they can compare. Colonoscopy: The patient declines this right now. She notes she would like to have financial average before this happens. She also asked about that fecal occult cards. Given her history of colon cancer in 2 primary family members, I have strongly recommended her to get a colonoscopy sooner rather than later. The patient is also due for hepatitis C screening, Tdap, flu vaccine, and Pap smear which she defers.

## 2016-06-07 DIAGNOSIS — R232 Flushing: Secondary | ICD-10-CM | POA: Insufficient documentation

## 2016-06-07 NOTE — Assessment & Plan Note (Signed)
Currently, the patient's symptoms are mild as they do not interfere with activity.  Discussed possible management of hot flashes. Given her need for further evaluation of her mammogram, I would like to avoid hormone replacement. Advised the patient to continue with black cohosh, noting that it would take several weeks for this to kick in. If this does not work, the patient could try low-dose vitamin E. Advised her to avoid beta-carotene during this time given there was a history of MI noted on her EMR.

## 2016-06-08 ENCOUNTER — Telehealth: Payer: Self-pay | Admitting: Licensed Clinical Social Worker

## 2016-06-08 NOTE — Progress Notes (Signed)
Clinical Social Work consult received from Dr. Leonides Schanzorsey, patient needs resources to assist with obtaining a repeat Mammogram.    Call to patient to assess needs for the above consult.  Patient has no insurance and does not know her options for getting a mammogram.  States she had a "[employer]" but it has expired.  LCSW provided patient with information on free breast and cervical screenings as well as how to renew her "[employer]".  Patient apprentice of information.  Plan: LCSW mailed patient two options for her mammogram, an application for the [facility]ogram scholarship program and several options on how to [facility].   Katie Hineseborah Jaelynn Pozo, LCSW Licensed Clinical Social Worker [facility]   [phone] 1:46 PM

## 2020-09-09 ENCOUNTER — Other Ambulatory Visit: Payer: Self-pay | Admitting: Internal Medicine

## 2020-09-11 ENCOUNTER — Other Ambulatory Visit: Payer: Self-pay | Admitting: Internal Medicine

## 2020-09-11 DIAGNOSIS — E2839 Other primary ovarian failure: Secondary | ICD-10-CM

## 2020-09-11 LAB — VITAMIN D 25 HYDROXY (VIT D DEFICIENCY, FRACTURES): Vit D, 25-Hydroxy: 13 ng/mL — ABNORMAL LOW (ref 30–100)

## 2020-09-11 LAB — COMPLETE METABOLIC PANEL WITH GFR
AG Ratio: 1.4 (calc) (ref 1.0–2.5)
ALT: 9 U/L (ref 6–29)
AST: 14 U/L (ref 10–35)
Albumin: 3.9 g/dL (ref 3.6–5.1)
Alkaline phosphatase (APISO): 68 U/L (ref 37–153)
BUN: 17 mg/dL (ref 7–25)
CO2: 25 mmol/L (ref 20–32)
Calcium: 9.4 mg/dL (ref 8.6–10.4)
Chloride: 108 mmol/L (ref 98–110)
Creat: 0.66 mg/dL (ref 0.50–1.05)
GFR, Est African American: 112 mL/min/{1.73_m2} (ref >=60)
GFR, Est Non African American: 97 mL/min/{1.73_m2} (ref >=60)
Globulin: 2.7 g/dL (calc) (ref 1.9–3.7)
Glucose, Bld: 80 mg/dL (ref 65–99)
Potassium: 4.6 mmol/L (ref 3.5–5.3)
Sodium: 143 mmol/L (ref 135–146)
Total Bilirubin: 0.5 mg/dL (ref 0.2–1.2)
Total Protein: 6.6 g/dL (ref 6.1–8.1)

## 2020-09-11 LAB — LIPID PANEL
Cholesterol: 193 mg/dL (ref <200)
HDL: 64 mg/dL (ref >=50)
LDL Cholesterol (Calc): 107 mg/dL (calc) — ABNORMAL HIGH
Non-HDL Cholesterol (Calc): 129 mg/dL (calc) (ref <130)
Total CHOL/HDL Ratio: 3 (calc) (ref <5.0)
Triglycerides: 120 mg/dL (ref <150)

## 2020-09-11 LAB — CBC
HCT: 40.3 % (ref 35.0–45.0)
Hemoglobin: 13.5 g/dL (ref 11.7–15.5)
MCH: 31.8 pg (ref 27.0–33.0)
MCHC: 33.5 g/dL (ref 32.0–36.0)
MCV: 94.8 fL (ref 80.0–100.0)
MPV: 10 fL (ref 7.5–12.5)
Platelets: 437 10*3/uL — ABNORMAL HIGH (ref 140–400)
RBC: 4.25 10*6/uL (ref 3.80–5.10)
RDW: 13 % (ref 11.0–15.0)
WBC: 6.7 10*3/uL (ref 3.8–10.8)

## 2020-09-11 LAB — URINE CULTURE
MICRO NUMBER:: 11439062
SPECIMEN QUALITY:: ADEQUATE

## 2020-09-11 LAB — TSH: TSH: 2.85 mIU/L (ref 0.40–4.50)

## 2020-09-16 ENCOUNTER — Other Ambulatory Visit: Payer: Self-pay | Admitting: Internal Medicine

## 2020-09-16 DIAGNOSIS — Z1231 Encounter for screening mammogram for malignant neoplasm of breast: Secondary | ICD-10-CM

## 2021-01-31 ENCOUNTER — Ambulatory Visit
Admission: RE | Admit: 2021-01-31 | Discharge: 2021-01-31 | Disposition: A | Discharge status: Home / Self Care | Payer: Self-pay | Source: Ambulatory Visit | Attending: Internal Medicine | Admitting: Internal Medicine

## 2021-01-31 ENCOUNTER — Other Ambulatory Visit: Payer: Self-pay

## 2021-01-31 DIAGNOSIS — Z1231 Encounter for screening mammogram for malignant neoplasm of breast: Secondary | ICD-10-CM

## 2021-01-31 DIAGNOSIS — E2839 Other primary ovarian failure: Secondary | ICD-10-CM

## 2021-02-02 ENCOUNTER — Ambulatory Visit (INDEPENDENT_AMBULATORY_CARE_PROVIDER_SITE_OTHER): Payer: 59 | Admitting: Podiatry

## 2021-02-02 ENCOUNTER — Other Ambulatory Visit: Payer: Self-pay

## 2021-02-02 DIAGNOSIS — B353 Tinea pedis: Secondary | ICD-10-CM

## 2021-02-02 MED ORDER — CLOTRIMAZOLE-BETAMETHASONE 1-0.05 % EX CREA: 1.0000 "application " | TOPICAL_CREAM | Freq: Two times a day (BID) | CUTANEOUS | 0 refills | Status: DC

## 2021-02-02 MED ORDER — CLOTRIMAZOLE-BETAMETHASONE 1-0.05 % EX CREA: TOPICAL_CREAM | CUTANEOUS | 0 refills | Status: DC

## 2021-02-02 MED ORDER — TERBINAFINE HCL 250 MG PO TABS: 250.0000 mg | ORAL_TABLET | Freq: Every day | ORAL | 0 refills | Status: AC

## 2021-02-03 ENCOUNTER — Encounter: Payer: Self-pay | Admitting: Podiatry

## 2021-02-03 NOTE — Progress Notes (Signed)
Subjective:  Patient ID: Katie Stephens, female    DOB: 04-Aug-1961,  MRN: [id number]  Chief Complaint  Patient presents with   Foot Pain    Bilateral dry skin peeling  Pt stated that it is painful and she has been dealing with it for about 2 years    59 y.o. female presents with the above complaint.  Patient presents with complaint of bilateral xerosis/athlete's foot.  Patient states been going for quite some time is very painful to touch.  Has been dealing with it for 2 years has tried some over-the-counter medication none of which has helped.  She would like to discuss treatment options for this.  His itches considerably.  There is pain associated with it now.  No fissuring noted.  Pain scale is 5 out of 10.  Is sharp shooting in nature.   Review of Systems: Negative except as noted in the HPI. Denies N/V/F/Ch.  No past medical history on file.  Current Outpatient Medications:    clotrimazole-betamethasone (LOTRISONE) cream, Apply 1 application topically 2 (two) times daily., Disp: 30 g, Rfl: 0   terbinafine (LAMISIL) 250 MG tablet, Take 1 tablet (250 mg total) by mouth daily., Disp: 30 tablet, Rfl: 0   azithromycin (ZITHROMAX) 250 MG tablet, TAKE 2 TABLETS BY MOUTH TODAY, THEN TAKE 1 TABLET DAILY FOR 4 DAYS, Disp: , Rfl:    clotrimazole-betamethasone (LOTRISONE) cream, SMARTSIG:2 Topical Twice Daily, Disp: 30 g, Rfl: 0   hydrOXYzine (ATARAX/VISTARIL) 25 MG tablet, Take 25 mg by mouth daily., Disp: , Rfl:    ketoconazole (NIZORAL) 200 MG tablet, Take 200 mg by mouth daily., Disp: , Rfl:    methylPREDNISolone (MEDROL) 4 MG tablet, TAKE 6 TABLETS ON DAY 1 AS DIRECTED ON PACKAGE AND DECREASE BY 1 TAB EACH DAY FOR A TOTAL OF 6 DAYS, Disp: , Rfl:    NYSTATIN powder, SMARTSIG:2 Topical Twice Daily, Disp: , Rfl:    vitamin E 100 UNIT capsule, Take 1 capsule (100 Units total) by mouth daily., Disp: 90 capsule, Rfl: 1  Social History   Tobacco Use  Smoking Status Some Days   Pack years: 0.00   Smokeless Tobacco Not on file    No Known Allergies Objective:  There were no vitals filed for this visit. There is no height or weight on file to calculate BMI. Constitutional Well developed. Well nourished.  Vascular Dorsalis pedis pulses palpable bilaterally. Posterior tibial pulses palpable bilaterally. Capillary refill normal to all digits.  No cyanosis or clubbing noted. Pedal hair growth normal.  Neurologic Normal speech. Oriented to person, place, and time. Epicritic sensation to light touch grossly present bilaterally.  Dermatologic Epidermal lysis of the skin noted to bilateral plantar foot with subjective complaint of itching mild redness and raised papules noted.  No fissuring of the skin noted  Orthopedic: Normal joint ROM without pain or crepitus bilaterally. No visible deformities. No bony tenderness.   Radiographs: None Assessment:   1. Tinea pedis of both feet    Plan:  Patient was evaluated and treated and all questions answered.  Bilateral athlete's foot severe -I explained the patient the etiology of athlete's foot and various treatment options were extensively discussed.  Given the amount of athlete's foot that is present I believe patient will benefit from oral medication to help decrease the fungal growth.  I believe she will benefit from Lamisil 30-day supply.  Which was sent to the pharmacy.  Her last liver function test was within normal limits. -I also believe she  would benefit from Lotrisone cream as well.  Which was sent to the pharmacy to be applied twice a day.  She states understanding  No follow-ups on file.

## 2021-03-16 ENCOUNTER — Ambulatory Visit: Payer: 59 | Admitting: Podiatry

## 2021-03-25 ENCOUNTER — Ambulatory Visit (INDEPENDENT_AMBULATORY_CARE_PROVIDER_SITE_OTHER): Payer: 59 | Admitting: Podiatry

## 2021-03-25 ENCOUNTER — Other Ambulatory Visit: Payer: Self-pay

## 2021-03-25 DIAGNOSIS — B353 Tinea pedis: Secondary | ICD-10-CM | POA: Diagnosis not present

## 2021-03-25 MED ORDER — CLOTRIMAZOLE-BETAMETHASONE 1-0.05 % EX CREA: TOPICAL_CREAM | CUTANEOUS | 0 refills | Status: DC

## 2021-03-25 MED ORDER — TERBINAFINE HCL 250 MG PO TABS: 250.0000 mg | ORAL_TABLET | Freq: Every day | ORAL | 0 refills | Status: DC

## 2021-03-25 MED ORDER — CLOTRIMAZOLE-BETAMETHASONE 1-0.05 % EX CREA: 1.0000 "application " | TOPICAL_CREAM | Freq: Two times a day (BID) | CUTANEOUS | 3 refills | Status: DC

## 2021-03-30 NOTE — Progress Notes (Signed)
  Subjective:  Patient ID: Katie Stephens, female    DOB: 11/08/1960,  MRN: [id number]  Chief Complaint  Patient presents with   Tinea Pedis    PT stated that she has had some improvement     60 y.o. female presents with the above complaint.  Patient presents with complaint of follow-up of bilateral xerosis/athlete's foot.  Patient states there is some improvement.  She states that it is going the right direction.  She would like to know if she can get a refill on both the medication.   Review of Systems: Negative except as noted in the HPI. Denies N/V/F/Ch.  No past medical history on file.  Current Outpatient Medications:    clotrimazole-betamethasone (LOTRISONE) cream, Apply 1 application topically 2 (two) times daily., Disp: 45 g, Rfl: 3   terbinafine (LAMISIL) 250 MG tablet, Take 1 tablet (250 mg total) by mouth daily., Disp: 30 tablet, Rfl: 0   azithromycin (ZITHROMAX) 250 MG tablet, TAKE 2 TABLETS BY MOUTH TODAY, THEN TAKE 1 TABLET DAILY FOR 4 DAYS, Disp: , Rfl:    clotrimazole-betamethasone (LOTRISONE) cream, Apply 1 application topically 2 (two) times daily., Disp: 30 g, Rfl: 0   clotrimazole-betamethasone (LOTRISONE) cream, SMARTSIG:2 Topical Twice Daily, Disp: 30 g, Rfl: 0   hydrOXYzine (ATARAX/VISTARIL) 25 MG tablet, Take 25 mg by mouth daily., Disp: , Rfl:    ketoconazole (NIZORAL) 200 MG tablet, Take 200 mg by mouth daily., Disp: , Rfl:    methylPREDNISolone (MEDROL) 4 MG tablet, TAKE 6 TABLETS ON DAY 1 AS DIRECTED ON PACKAGE AND DECREASE BY 1 TAB EACH DAY FOR A TOTAL OF 6 DAYS, Disp: , Rfl:    NYSTATIN powder, SMARTSIG:2 Topical Twice Daily, Disp: , Rfl:    vitamin E 100 UNIT capsule, Take 1 capsule (100 Units total) by mouth daily., Disp: 90 capsule, Rfl: 1  Social History   Tobacco Use  Smoking Status Some Days  Smokeless Tobacco Not on file    No Known Allergies Objective:  There were no vitals filed for this visit. There is no height or weight on file to  calculate BMI. Constitutional Well developed. Well nourished.  Vascular Dorsalis pedis pulses palpable bilaterally. Posterior tibial pulses palpable bilaterally. Capillary refill normal to all digits.  No cyanosis or clubbing noted. Pedal hair growth normal.  Neurologic Normal speech. Oriented to person, place, and time. Epicritic sensation to light touch grossly present bilaterally.  Dermatologic Improving epidermal lysis of the skin noted to bilateral plantar foot with subjective complaint of itching mild redness and raised papules noted.  No fissuring of the skin noted  Orthopedic: Normal joint ROM without pain or crepitus bilaterally. No visible deformities. No bony tenderness.   Radiographs: None Assessment:   1. Tinea pedis of both feet     Plan:  Patient was evaluated and treated and all questions answered.  Bilateral athlete's foot severe -I explained the patient the etiology of athlete's foot and various treatment options were extensively discussed.  Given the amount of athlete's foot that is present I believe patient will benefit from oral medication to help decrease the fungal growth.  I believe she will benefit from another 30-day supply of Lamisil as it is working on improving it.  She was able to tolerate the previous course. -Refill on Lotrisone was given.  I instructed her to continue using it.  No follow-ups on file.

## 2021-04-13 ENCOUNTER — Other Ambulatory Visit: Payer: Self-pay | Admitting: Podiatry

## 2021-04-13 ENCOUNTER — Telehealth: Payer: Self-pay | Admitting: Podiatry

## 2021-04-13 MED ORDER — CLOTRIMAZOLE-BETAMETHASONE 1-0.05 % EX CREA: 1.0000 "application " | TOPICAL_CREAM | Freq: Two times a day (BID) | CUTANEOUS | 6 refills | Status: AC

## 2021-04-13 NOTE — Telephone Encounter (Signed)
Pt called requesting refill on cream. Please advise.

## 2021-04-15 ENCOUNTER — Telehealth: Payer: Self-pay | Admitting: Podiatry

## 2021-04-15 NOTE — Telephone Encounter (Signed)
Patient called and stated that the pharmacy did not recieved the prescription for the Lamisil. Please resend.

## 2021-04-18 MED ORDER — TERBINAFINE HCL 250 MG PO TABS: 250.0000 mg | ORAL_TABLET | Freq: Every day | ORAL | 0 refills | Status: AC

## 2021-04-18 NOTE — Addendum Note (Signed)
Addended by: Nicholes Rough on: 04/18/2021 07:59 AM   Modules accepted: Orders

## 2022-02-03 ENCOUNTER — Other Ambulatory Visit: Payer: Self-pay

## 2022-02-03 ENCOUNTER — Ambulatory Visit
Admission: EM | Admit: 2022-02-03 | Discharge: 2022-02-03 | Disposition: A | Discharge status: Home / Self Care | Payer: Commercial Managed Care - HMO | Attending: Internal Medicine | Admitting: Internal Medicine

## 2022-02-03 ENCOUNTER — Encounter (HOSPITAL_BASED_OUTPATIENT_CLINIC_OR_DEPARTMENT_OTHER): Payer: Self-pay

## 2022-02-03 ENCOUNTER — Emergency Department (HOSPITAL_BASED_OUTPATIENT_CLINIC_OR_DEPARTMENT_OTHER)
Admission: EM | Admit: 2022-02-03 | Discharge: 2022-02-03 | Disposition: A | Discharge status: Home / Self Care | Payer: Commercial Managed Care - HMO | Attending: Emergency Medicine | Admitting: Emergency Medicine

## 2022-02-03 ENCOUNTER — Emergency Department (HOSPITAL_BASED_OUTPATIENT_CLINIC_OR_DEPARTMENT_OTHER): Payer: Commercial Managed Care - HMO | Admitting: Radiology

## 2022-02-03 DIAGNOSIS — R319 Hematuria, unspecified: Secondary | ICD-10-CM

## 2022-02-03 DIAGNOSIS — R202 Paresthesia of skin: Secondary | ICD-10-CM | POA: Insufficient documentation

## 2022-02-03 DIAGNOSIS — R200 Anesthesia of skin: Secondary | ICD-10-CM

## 2022-02-03 LAB — BASIC METABOLIC PANEL
Anion gap: 8 (ref 5–15)
BUN: 11 mg/dL (ref 6–20)
CO2: 24 mmol/L (ref 22–32)
Calcium: 9.9 mg/dL (ref 8.9–10.3)
Chloride: 106 mmol/L (ref 98–111)
Creatinine, Ser: 0.62 mg/dL (ref 0.44–1.00)
GFR, Estimated: >60 mL/min (ref >60)
Glucose, Bld: 93 mg/dL (ref 70–99)
Potassium: 4.1 mmol/L (ref 3.5–5.1)
Sodium: 138 mmol/L (ref 135–145)

## 2022-02-03 LAB — URINALYSIS, ROUTINE W REFLEX MICROSCOPIC
Bilirubin Urine: NEGATIVE
Glucose, UA: NEGATIVE mg/dL
Ketones, ur: NEGATIVE mg/dL
Nitrite: NEGATIVE
Protein, ur: NEGATIVE mg/dL
Specific Gravity, Urine: 1.014 (ref 1.005–1.030)
pH: 6 (ref 5.0–8.0)

## 2022-02-03 LAB — CBC
HCT: 44 % (ref 36.0–46.0)
Hemoglobin: 14.3 g/dL (ref 12.0–15.0)
MCH: 31 pg (ref 26.0–34.0)
MCHC: 32.5 g/dL (ref 30.0–36.0)
MCV: 95.2 fL (ref 80.0–100.0)
Platelets: 347 10*3/uL (ref 150–400)
RBC: 4.62 MIL/uL (ref 3.87–5.11)
RDW: 14 % (ref 11.5–15.5)
WBC: 5.8 10*3/uL (ref 4.0–10.5)
nRBC: 0 % (ref 0.0–0.2)

## 2022-02-03 LAB — TROPONIN I (HIGH SENSITIVITY): Troponin I (High Sensitivity): 4 ng/L (ref <18)

## 2022-02-03 NOTE — ED Provider Notes (Addendum)
[name]    CSN: [id number] Arrival date & time: 02/03/22  1048      History   Chief Complaint Chief Complaint  Patient presents with   left arm weakness    HPI Katie Stephens is a 61 y.o. female.   Patient presents with left arm numbness that has been present for approximately 1 week.  Denies any pain to the arm.  Denies any associated chest pain, shortness of breath, nausea, vomiting, dizziness.  She does report that she been having intermittent headache and blurred vision that started before arm numbness.  She is attributing blurry vision to decreased visual acuity as she reports that it mainly occurs when she is trying to read something.  She reports history of myocardial infarction and had similar left arm symptoms when this occurred approximately 20 years ago.  Denies any injury to the arm.  Denies any pain.  Symptoms are not exacerbated with movement.  No aggravating or relieving factors to symptoms.     History reviewed. No pertinent past medical history.  Patient Active Problem List   Diagnosis Date Noted   Hot flashes 06/07/2016   Health care maintenance 06/06/2016   Possible exposure to STD 08/28/2014   BREAST MASS, LEFT 11/03/2009   TOBACCO USE 10/23/2006   MYOCARDIAL INFARCTION, HX OF 10/23/2006   MENORRHAGIA 10/23/2006   ECZEMA, ATOPIC DERMATITIS 10/18/2006    History reviewed. No pertinent surgical history.  OB History   No obstetric history on file.      Home Medications    Prior to Admission medications   Medication Sig Start Date End Date Taking? Authorizing Provider  azithromycin (ZITHROMAX) 250 MG tablet TAKE 2 TABLETS BY MOUTH TODAY, THEN TAKE 1 TABLET DAILY FOR 4 DAYS 08/15/20   [provider]  clotrimazole-betamethasone (LOTRISONE) cream Apply 1 application topically 2 (two) times daily. 02/02/21   Candelaria Stagers, DPM  clotrimazole-betamethasone (LOTRISONE) cream Apply 1 application topically 2 (two) times daily.  03/25/21   Candelaria Stagers, DPM  clotrimazole-betamethasone (LOTRISONE) cream SMARTSIG:2 Topical Twice Daily 03/25/21   Candelaria Stagers, DPM  clotrimazole-betamethasone (LOTRISONE) cream Apply 1 application topically 2 (two) times daily. 04/13/21   Candelaria Stagers, DPM  hydrOXYzine (ATARAX/VISTARIL) 25 MG tablet Take 25 mg by mouth daily. 01/21/21   [provider]  ketoconazole (NIZORAL) 200 MG tablet Take 200 mg by mouth daily. 01/21/21   [provider]  methylPREDNISolone (MEDROL) 4 MG tablet TAKE 6 TABLETS ON DAY 1 AS DIRECTED ON PACKAGE AND DECREASE BY 1 TAB EACH DAY FOR A TOTAL OF 6 DAYS 08/15/20   [provider]  NYSTATIN powder SMARTSIG:2 Topical Twice Daily 01/21/21   [provider]  vitamin E 100 UNIT capsule Take 1 capsule (100 Units total) by mouth daily. 06/05/16   Joanna Puff, MD    Family History History reviewed. No pertinent family history.  Social History Social History   Tobacco Use   Smoking status: Some Days     Allergies   Patient has no known allergies.   Review of Systems Review of Systems Per HPI  Physical Exam Triage Vital Signs ED Triage Vitals [02/03/22 1059]  Enc Vitals Group     BP 125/84     Pulse Rate 72     Resp 18     Temp 98 F (36.7 C)     Temp Source Oral     SpO2 97 %     Weight  Height      Head Circumference      Peak Flow      Pain Score 0     Pain Loc      Pain Edu?      Excl. in GC?    No data found.  Updated Vital Signs BP 125/84 (BP Location: Right Arm)   Pulse 72   Temp 98 F (36.7 C) (Oral)   Resp 18   LMP 12/28/2011   SpO2 97%   Visual Acuity Right Eye Distance:   Left Eye Distance:   Bilateral Distance:    Right Eye Near:   Left Eye Near:    Bilateral Near:     Physical Exam Constitutional:      General: She is not in acute distress.    Appearance: Normal appearance. She is not toxic-appearing or diaphoretic.  HENT:     Head: Normocephalic and atraumatic.   Eyes:     Extraocular Movements: Extraocular movements intact.     Conjunctiva/sclera: Conjunctivae normal.  Cardiovascular:     Rate and Rhythm: Normal rate and regular rhythm.     Pulses: Normal pulses.     Heart sounds: Normal heart sounds.  Pulmonary:     Effort: Pulmonary effort is normal. No respiratory distress.     Breath sounds: Normal breath sounds.  Musculoskeletal:     Comments: No tenderness to palpation throughout neck, shoulder, upper extremity.  Grip strength 5/5.  Pulses normal.  No obvious discoloration or swelling noted.  Neurological:     General: No focal deficit present.     Mental Status: She is alert and oriented to person, place, and time. Mental status is at baseline.  Psychiatric:        Mood and Affect: Mood normal.        Behavior: Behavior normal.        Thought Content: Thought content normal.        Judgment: Judgment normal.      UC Treatments / Results  Labs (all labs ordered are listed, but only abnormal results are displayed) Labs Reviewed - No data to display  EKG   Radiology No results found.  Procedures Procedures (including critical care time)  Medications Ordered in UC Medications - No data to display  Initial Impression / Assessment and Plan / UC Course  I have reviewed the triage vital signs and the nursing notes.  Pertinent labs & imaging results that were available during my care of the patient were reviewed by me and considered in my medical decision making (see chart for details).     EKG was fairly unremarkable with normal sinus rhythm.  No obvious indications that patient's symptoms could be musculoskeletal in nature.  Unable to adequately rule out cardiac or neurovascular etiology especially given patient's history.  Therefore, patient was advised to go to the hospital for further evaluation and management given limited resources here at urgent care.  Patient was agreeable with plan.  Patient left via self  transport. Final Clinical Impressions(s) / UC Diagnoses   Final diagnoses:  Left arm numbness     Discharge Instructions      Go to the ER as soon as you leave urgent care for further evaluation and management.     ED Prescriptions   None    PDMP not reviewed this encounter.   Gustavus Bryant, [place] 02/03/22 1132    Gustavus Bryant, [place] 02/03/22 1133

## 2022-02-03 NOTE — ED Provider Notes (Signed)
MEDCENTER [facility] EMERGENCY DEPT Provider Note   CSN: [id number] Arrival date & time: 02/03/22  1156     History  Chief Complaint  Patient presents with   Numbness    Katie Stephens is a 61 y.o. female presenting today with intermittent numbness to left arm.  Usually worse in the mornings, usually fades within a few hours.  Comes and goes at random.  Does not seem to be related to exertion, as she walks several miles 3-4 times per week without significant symptoms during or following exercise.  Over the last 24 hours, the numbness was not resolving.  Hx of STEMI back in 2004 with similar but more severe symptoms.  Seen by UC today, who encouraged her to continue to ED for cardiac work-up.  Denies chest pain, shortness of breath, dizziness, lightheadedness, unilateral or any extremity weakness.  No recent injury, fevers, headache, vision changes.  Denies urinary symptoms, N/V/D, or constipation.  Requesting resources for PCP.  Hx of left breast mass.    The history is provided by the patient and medical records.       Home Medications Prior to Admission medications   Medication Sig Start Date End Date Taking? Authorizing Provider  azithromycin (ZITHROMAX) 250 MG tablet TAKE 2 TABLETS BY MOUTH TODAY, THEN TAKE 1 TABLET DAILY FOR 4 DAYS 08/15/20   [provider]  clotrimazole-betamethasone (LOTRISONE) cream Apply 1 application topically 2 (two) times daily. 02/02/21   Candelaria Stagers, DPM  clotrimazole-betamethasone (LOTRISONE) cream Apply 1 application topically 2 (two) times daily. 03/25/21   Candelaria Stagers, DPM  clotrimazole-betamethasone (LOTRISONE) cream SMARTSIG:2 Topical Twice Daily 03/25/21   Candelaria Stagers, DPM  clotrimazole-betamethasone (LOTRISONE) cream Apply 1 application topically 2 (two) times daily. 04/13/21   Candelaria Stagers, DPM  hydrOXYzine (ATARAX/VISTARIL) 25 MG tablet Take 25 mg by mouth daily. 01/21/21   [provider]  ketoconazole (NIZORAL) 200 MG  tablet Take 200 mg by mouth daily. 01/21/21   [provider]  methylPREDNISolone (MEDROL) 4 MG tablet TAKE 6 TABLETS ON DAY 1 AS DIRECTED ON PACKAGE AND DECREASE BY 1 TAB EACH DAY FOR A TOTAL OF 6 DAYS 08/15/20   [provider]  NYSTATIN powder SMARTSIG:2 Topical Twice Daily 01/21/21   [provider]  vitamin E 100 UNIT capsule Take 1 capsule (100 Units total) by mouth daily. 06/05/16   Joanna Puff, MD      Allergies    Patient has no known allergies.    Review of Systems   Review of Systems  Neurological:  Positive for numbness.    Physical Exam Updated Vital Signs BP 120/72   Pulse 60   Temp 97.7 F (36.5 C)   Resp 19   Ht 5' (1.524 m)   Wt 72.1 kg   LMP 12/28/2011   SpO2 99%   BMI 31.05 kg/m  Physical Exam Vitals and nursing note reviewed. Exam conducted with a chaperone present.  Constitutional:      General: She is not in acute distress.    Appearance: Normal appearance. She is well-developed. She is not ill-appearing or diaphoretic.  HENT:     Head: Normocephalic and atraumatic.  Eyes:     Conjunctiva/sclera: Conjunctivae normal.  Cardiovascular:     Rate and Rhythm: Normal rate and regular rhythm.     Pulses: Normal pulses.          Radial pulses are 2+ on the right side and 2+ on the left side.  Dorsalis pedis pulses are 2+ on the right side and 2+ on the left side.     Heart sounds: Normal heart sounds. No murmur heard.    Comments: RRR without M/R/G.  CRT less than 2 seconds of all extremities. Pulmonary:     Effort: Pulmonary effort is normal. No respiratory distress.     Breath sounds: Normal breath sounds.  Chest:     Chest wall: No mass, lacerations, deformity, swelling, tenderness, crepitus or edema.  Breasts:    Breasts are symmetrical.     Right: Normal. No swelling, bleeding, inverted nipple, mass, nipple discharge, skin change or tenderness.     Left: Normal. No swelling, bleeding, inverted nipple, mass, nipple  discharge, skin change or tenderness.  Abdominal:     Palpations: Abdomen is soft.     Tenderness: There is no abdominal tenderness.  Musculoskeletal:        General: No swelling, tenderness or signs of injury.     Cervical back: Neck supple. No rigidity or tenderness.     Right lower leg: No edema.     Left lower leg: No edema.     Comments: Small palpable mobile nontender cyst of the left forearm.  Without elicited tenderness of upper extremities with AROM or PROM.  Upper extremities appear neurovascularly intact.  Lymphadenopathy:     Upper Body:     Right upper body: No supraclavicular, axillary or pectoral adenopathy.     Left upper body: No supraclavicular, axillary or pectoral adenopathy.  Skin:    General: Skin is warm and dry.     Capillary Refill: Capillary refill takes less than 2 seconds.  Neurological:     General: No focal deficit present.     Mental Status: She is alert and oriented to person, place, and time.     GCS: GCS eye subscore is 4. GCS verbal subscore is 5. GCS motor subscore is 6.     Cranial Nerves: No dysarthria or facial asymmetry.     Sensory: No sensory deficit.     Motor: No weakness.     Coordination: Coordination normal.     Comments: No subjective or appreciated diminished sensation/numbness of the upper extremities bilaterally.  Without dysarthria, facial asymmetry, abnormal EOMs, tremor, seizure activity, pronator drift, abnormal coordination, or abnormal gait  Psychiatric:        Mood and Affect: Mood normal.     ED Results / Procedures / Treatments   Labs (all labs ordered are listed, but only abnormal results are displayed) Labs Reviewed  URINALYSIS, ROUTINE W REFLEX MICROSCOPIC - Abnormal; Notable for the following components:      Result Value   Hgb urine dipstick TRACE (*)    Leukocytes,Ua SMALL (*)    Bacteria, UA RARE (*)    All other components within normal limits  URINE CULTURE  BASIC METABOLIC PANEL  CBC  TROPONIN I (HIGH  SENSITIVITY)  TROPONIN I (HIGH SENSITIVITY)    EKG EKG Interpretation  Date/Time:  Friday February 03 2022 12:08:10 EDT Ventricular Rate:  67 PR Interval:  182 QRS Duration: 60 QT Interval:  412 QTC Calculation: 435 R Axis:   -22 Text Interpretation: Normal sinus rhythm Normal ECG No previous ECGs available Confirmed by Margarita Grizzle [phone]) on 02/03/2022 2:17:58 PM  Radiology DG Chest 2 View  Result Date: 02/03/2022 CLINICAL DATA:  Radiating numbness down arm EXAM: CHEST - 2 VIEW COMPARISON:  None Available. FINDINGS: The heart size and mediastinal contours are within normal limits.  Both lungs are clear. The visualized skeletal structures are unremarkable. IMPRESSION: No active cardiopulmonary disease. Electronically Signed   By: Marjo Bicker M.D.   On: 02/03/2022 14:34    Procedures Procedures    Medications Ordered in ED Medications - No data to display  ED Course/ Medical Decision Making/ A&P                           Medical Decision Making Amount and/or Complexity of Data Reviewed External Data Reviewed: notes. Labs: ordered. Decision-making details documented in ED Course. Radiology: ordered and independent interpretation performed. Decision-making details documented in ED Course. ECG/medicine tests: ordered and independent interpretation performed. Decision-making details documented in ED Course.  Risk OTC drugs. Prescription drug management.   61 y.o. female presents to the ED for concern of Numbness   This involves an extensive number of treatment options, and is a complaint that carries with it a high risk of complications and morbidity.  The emergent differential diagnosis prior to evaluation includes, but is not limited to: CVA, ACS, neuropathy, fracture, MSK benign etiology  This is not an exhaustive differential.   Past Medical History / Co-morbidities / Social History: Hx of prior MI (STEMI), atopic dermatitis, left breast mass, tobacco use Social  Determinants of Health include daily tobacco use, for cessation counseling was provided  Additional History:  Internal and external records from outside source obtained and reviewed including Family Medicine, Cardiology, Urgent Care  Physical Exam: Physical exam performed. The pertinent findings include: Overall unremarkable physical exam  Lab Tests: I ordered, and personally interpreted labs.  The pertinent results include:   CBC: Unremarkable CMP/BMP: Unremarkable Troponin: Negative, 4 UA: Trace Hgb, small leukocytes, WBC 11-20.  Suggestive of possible mild asymptomatic UTI.  Imaging Studies: I ordered imaging studies including CXR.  I independently visualized and interpreted said imaging.  Pertinent results include: No acute cardiopulmonary pathology. I agree with the radiologist interpretation.  Medications: I have reviewed the patients home medicines and have made adjustments as needed  ED Course/Disposition: Pt well-appearing on exam.  Presenting with intermittent numbness of the left upper extremity.  Hx of STEMI 15 years ago with similar but more severe symptoms.  Without chest pain, shortness of breath, upper extremity weakness.  Remains appear neurovascularly intact.  Troponin negative and EKG unremarkable.  Not suspicious of ACS at this time.  CXR negative for acute pathology.  No neurodeficits appreciated on exam as described above.  Low suspicion for CVA or TIA.  Without significant swelling or edema, warmth, or evidence of infection.  Not suspicious of DVT, cellulitis, vena cava syndrome.  Breast exam unremarkable, no evidence of obvious lymphadenopathy appreciated.  Labs unremarkable.  Upper extremities intact and non-edematous.  Not evident of advanced malignancy at this time.  Overall, I am uncertain the exact etiology of the patient's symptoms.  However, I do not believe she is currently experiencing a medical, surgical, or psychiatric emergency.  Mild suspicion of benign  MSK etiology such as strain or nerve entrapment.  Recommend close follow-up with PCP.  Patient without PCP, resources provided.  Patient satisfied with today's encounter.  Patient in NAD in good condition at time of discharge.  Incidental finding of possible mild asymptomatic UTI and very small amounts of Hgb in the urine via labs.  Patient without urinary symptoms.  Per pt, chronic painless hematuria for a few years now and without prior workup.  Patient is postmenopausal and denies vaginal bleeding.  Hx of chronic tobacco use.  May be due to mild asymptomatic UTI, unable to rule out malignancy.  Due to the painless blood identified in urinalysis, recommend close follow-up with urology for further work-up.  After consideration of the diagnostic results and the patient's encounter today, I feel that the emergency department workup does not suggest an emergent condition requiring admission or immediate intervention beyond what has been performed at this time.  The patient is safe for discharge and has been instructed to return immediately for worsening symptoms, change in symptoms or any other concerns.  Discussed course of treatment thoroughly with the patient, whom demonstrated understanding.  Patient in agreement and has no further questions.  I discussed this case with my attending physician Dr. Rosalia Hammers, who agreed with the proposed treatment course and cosigned this note including patient's presenting symptoms, physical exam, and planned diagnostics and interventions.  Attending physician stated agreement with plan or made changes to plan which were implemented.     This chart was dictated using voice recognition software.  Despite best efforts to proofread, errors can occur which can change the documentation meaning.         Final Clinical Impression(s) / ED Diagnoses Final diagnoses:  Arm numbness left  Painless hematuria    Rx / DC Orders ED Discharge Orders     None          Sandrea Hammond 02/03/22 2316    Margarita Grizzle, MD 02/06/22 [phone]

## 2022-02-03 NOTE — Discharge Instructions (Addendum)
Below you have been provided the contact information for local primary care offices.  Please call and schedule an appointment to establish care as soon as possible for follow-up and coordinated care.  You may also request other resources such as gynecology or cardiology from your primary care as well.  You have also been provided the contact information for a local urologist.  Please call and schedule an appointment for further evaluation based on your urinalysis results today, as soon as possible.  Return to the ED for new or worsening symptoms as discussed.  If you do not have a doctor see the list below.  RESOURCE GUIDE  Chronic Pain Problems: Contact [facility]  [phone] Patients need to be referred by their primary care doctor.  Insufficient Money for Medicine: Contact United Way:  call "211" or [facility] [phone].  No Primary Care Doctor: Call Health Connect  [phone] - can help you locate a primary care doctor that  accepts your insurance, provides certain services, etc. Physician Referral Service- [phone] Agencies that provide inexpensive medical care: [facility]  [phone] [facility]  [phone] [facility]  [phone] [facility]  [phone] Planned Parenthood  [phone] [facility] Child Clinic  [phone]  Medicaid-accepting [facility] Providers: [facility]- [address]  [phone], Mon-Fri 9am-7pm, Sat 9am-1pm [facility]- [address] [place], [address] [place]  [phone] [facility]- [address] [place]  [phone] [facility] Family Medicine- [address]  [phone] Renaye Rakers- [address] [place], [address], [phone]  Only accepts [place] patients after they have their name  applied to their card  Self Pay (no insurance) in [facility]: Sickle Cell Patients: Dr Willey Blade,  [facility] Internal Medicine  [address] [place], [phone] [facility] Care- [address] [place]  [phone]       [facility] [place]- [address]       -     [facility]- see information above (Speak to [employer] if you do not have insurance)       -  [facility]- [address], [phone]       -  [facility]- [address],  [phone]       -  [facility]- [address] [phone]       -  Dr Julio Sicks-  [address], [place], [phone]       -  [facility]- [address], [phone]       -  [facility]- [address] [phone], also [address] [phone]       -    [facility]- [address] [place], [phone], 1st & 3rd Saturday   every month, 10am-1pm  1) Find a Doctor and Pay Out of Pocket Although you won't have to find out who is covered by your insurance plan, it is a good idea to ask around and get recommendations. You will then need to call the office and see if the doctor you have chosen will accept you as a new patient and what types of options they offer for patients who are self-pay. Some doctors offer discounts or will set up payment plans for their patients  who do not have insurance, but you will need to ask so you aren't surprised when you get to your appointment.  2) Contact Your Local Health Department Not all health departments have doctors that can see patients for sick visits, but many do, so it is worth a call to see if yours does. If you don't know where your local health department is, you can check in your phone book. The CDC also has a tool to help you locate your state's health department, and many state websites also have listings of all of their local health departments.  3) Find a Walk-in Clinic If your illness is not likely to be very severe or complicated, you may want to try a walk in clinic. These are popping  up all over the country in pharmacies, drugstores, and shopping centers. They're usually staffed by nurse practitioners or physician assistants that have been trained to treat common illnesses and complaints. They're usually fairly quick and inexpensive. However, if you have serious medical issues or chronic medical problems, these are probably not your best option  STD Testing [facility] Department of [facility], STD Clinic, [address] [place], phone [phone] or [phone].  Monday - Friday, call for an appointment. [facility] Department of [employer], STD Clinic, [address], [place], phone [phone] or [phone].  Monday - Friday, call for an appointment.  Abuse/Neglect: [facility] Child Abuse Hotline [phone] [facility] Child Abuse Hotline 6787457253 (After Hours)  Emergency Shelter:  [facility] 402-818-1798  Maternity Homes: Room at the [place] [facility] [phone] [facility] [phone]  MRSA Hotline #:   207-657-0990  [facility] Resources  [facility] [place]     United Way                          [facility] Dept. [address] [place]                       [address]      [address] [place] [address]  Blondell Reveal Phone:  [phone]                                   Phone:  [phone]                 Phone:  [phone]  [facility], [phone] [facility]- [phone]       -     [facility] in [place], [address],                                  [phone], [facility] Child Abuse Hotline (954)369-9739 or 406 162 7643 (After Hours)  Behavioral Health Services  Substance Abuse Resources: [facility] Drug Services   (414) 480-8239 [facility] 340-629-9681 [facility] [phone] Floydene Flock 559-710-9124 Residential & Outpatient Substance Abuse Program  539-123-9026  Psychological Services: [facility] Health  [phone] [facility]  [phone] [facility], [phone] [place]. [address], [place], ACCESS LINE: (364) 739-0797 or 417-306-4854, [employer]za  Dental Assistance  Patients with Medicaid: [facility] [phone] [address]                                           [phone] [address] [employer] Phone:  [phone]                                                  Phone:  4632267021  If unable to pay or uninsured, contact:  [facility] or [facility]. to become qualified for the adult dental clinic.  Patients with Medicaid: [facility] [phone] [address], [phone] [address] [phone]  If unable to pay, or uninsured, contact [facility] [phone]) or [facility] Department ([phone] in [place], [phone] in [facility]) to become qualified for the adult dental clinic  Other Low-Cost Community Dental Services: [facility]- [address] [place], [place], [place], [address], [phone], Ext. [phone], 2nd and 4th Thursday of the month at 6:30am.  10 clients each day by appointment, can sometimes see walk-in patients if someone does not show for an appointment. [facility]- [address] [place], [place], [address], [phone] [facility] [address] [place], [place], [address], [phone] [facility] Health Department- [phone] [facility] Health Department- [phone] [facility] [phone]

## 2022-02-03 NOTE — ED Triage Notes (Signed)
Pt c/o left arm numbness and weakness

## 2022-02-03 NOTE — Discharge Instructions (Signed)
Go to the ER as soon as you leave urgent care for further evaluation and management. 

## 2022-02-03 NOTE — ED Triage Notes (Signed)
Patient here POV from Home.  Endorses approximately 1 Week PTA (Friday, 1100) she began to have Numbness to Left Upper Extremity from Shoulder to Digits.   Endorses No Loss of Strength, CP, SOB, . Does endorse Blurry Vision at Times.  NAD Noted during Triage. A&Ox4. GCS 15. Ambulatory.

## 2022-02-06 LAB — URINE CULTURE: Culture: >=100000 — AB

## 2022-02-07 ENCOUNTER — Telehealth: Payer: Self-pay

## 2022-02-07 NOTE — Telephone Encounter (Signed)
Post ED Visit - Positive Culture Follow-up  Culture report reviewed by antimicrobial stewardship pharmacist: [facility] [x]  [place]oh[place], Pharm.D. []  Francene Finders, Pharm.D., BCPS AQ-ID []  , Pharm.D., BCPS []  Celedonio Miyamoto, Pharm.D., BCPS []  [place], Garvin Fila.D., BCPS, AAHIVP []  , Pharm.D., BCPS, AAHIVP []  Georgina Pillion, PharmD, BCPS []  , PharmD, BCPS []  [place], PharmD, BCPS []  [address], PharmD []  , PharmD, BCPS []  Estella Husk, PharmD  Pharmacy Team []  Lysle Pearl, PharmD []  , PharmD []  Phillips Climes, PharmD []  , Rph []  Agapito Games) , PharmD []  Verlan Friends, PharmD []  , PharmD []  Mervyn Gay, PharmD []  , PharmD []  Vinnie Level, PharmD []  Wonda Olds, PharmD []  , PharmD []  Len Childs, PharmD   Positive urine culture Discussed with ED provider: PA-C Plan - No further treatment indicated at this time for asymptomatic bacterium. May be chronically colonized. Already has Urology follow up recommendation.  No further patient follow-up is required at this time.  Greer Pickerel 02/07/2022, 10:50 AM

## 2022-02-07 NOTE — Progress Notes (Signed)
ED Antimicrobial Stewardship Positive Culture Follow Up   Katie Stephens is an 61 y.o. female who presented to [facility] on 02/03/2022 with a chief complaint of  Chief Complaint  Patient presents with   Numbness    Recent Results (from the past 720 hour(s))  Urine Culture     Status: Abnormal   Collection Time: 02/03/22  3:52 PM   Specimen: Urine, Clean Catch  Result Value Ref Range Status   Specimen Description   Final    URINE, CLEAN CATCH Performed at [facility], [address], [place], [place] [address]    Special Requests   Final    NONE Performed at [facility], [address] [place], [place] [address]    Culture >=100,000 COLONIES/mL KLEBSIELLA PNEUMONIAE (A)  Final   Report Status 02/06/2022 FINAL  Final   Organism ID, Bacteria KLEBSIELLA PNEUMONIAE (A)  Final      Susceptibility   Klebsiella pneumoniae - MIC*    AMPICILLIN RESISTANT Resistant     CEFAZOLIN <=4 SENSITIVE Sensitive     CEFEPIME <=0.12 SENSITIVE Sensitive     CEFTRIAXONE <=0.25 SENSITIVE Sensitive     CIPROFLOXACIN <=0.25 SENSITIVE Sensitive     GENTAMICIN <=1 SENSITIVE Sensitive     IMIPENEM <=0.25 SENSITIVE Sensitive     NITROFURANTOIN 64 INTERMEDIATE Intermediate     TRIMETH/SULFA <=20 SENSITIVE Sensitive     AMPICILLIN/SULBACTAM 4 SENSITIVE Sensitive     PIP/TAZO <=4 SENSITIVE Sensitive     * >=100,000 COLONIES/mL KLEBSIELLA PNEUMONIAE    [x]  Patient discharged originally without antimicrobial agent  New antibiotic prescription: No further treatment indicated at this time due to asymptomatic bacteruria. Patient already recommended for outpatient Urology follow-up appointment.  ED Provider: , PA-C   Claude Manges Dohlen 02/07/2022, 8:37 AM Clinical Pharmacist Monday - Friday phone -  [phone] Saturday - Sunday phone - [phone]

## 2022-05-08 ENCOUNTER — Other Ambulatory Visit: Payer: Self-pay

## 2022-05-08 ENCOUNTER — Encounter (HOSPITAL_BASED_OUTPATIENT_CLINIC_OR_DEPARTMENT_OTHER): Payer: Self-pay | Admitting: Emergency Medicine

## 2022-05-08 ENCOUNTER — Emergency Department (HOSPITAL_BASED_OUTPATIENT_CLINIC_OR_DEPARTMENT_OTHER)
Admission: EM | Admit: 2022-05-08 | Discharge: 2022-05-08 | Disposition: A | Discharge status: Home / Self Care | Payer: Commercial Managed Care - HMO | Attending: Emergency Medicine | Admitting: Emergency Medicine

## 2022-05-08 DIAGNOSIS — M25512 Pain in left shoulder: Secondary | ICD-10-CM | POA: Insufficient documentation

## 2022-05-08 DIAGNOSIS — Z79899 Other long term (current) drug therapy: Secondary | ICD-10-CM | POA: Diagnosis not present

## 2022-05-08 MED ORDER — KETOROLAC TROMETHAMINE 15 MG/ML IJ SOLN
15.0000 mg | Freq: Once | INTRAMUSCULAR | Status: AC
  Administered 2022-05-08: 15 mg via INTRAMUSCULAR
  Filled 2022-05-08: qty 1

## 2022-05-08 MED ORDER — CYCLOBENZAPRINE HCL 10 MG PO TABS: 10.0000 mg | ORAL_TABLET | Freq: Two times a day (BID) | ORAL | 0 refills | Status: DC | PRN

## 2022-05-08 MED ORDER — LIDOCAINE 5 % EX PTCH: 1.0000 | MEDICATED_PATCH | CUTANEOUS | 0 refills | Status: DC

## 2022-05-08 MED ORDER — LIDOCAINE 5 % EX PTCH
1.0000 | MEDICATED_PATCH | CUTANEOUS | Status: DC
  Administered 2022-05-08: 1 via TRANSDERMAL
  Filled 2022-05-08: qty 1

## 2022-05-08 NOTE — ED Provider Notes (Signed)
[facility] EMERGENCY DEPT Provider Note   CSN: [id number] Arrival date & time: 05/08/22  0849     History  Chief Complaint  Patient presents with   Shoulder Pain    Katie Stephens is a 61 y.o. female.   Shoulder Pain   61 year old female presents emergency department with complaints of left shoulder pain.  Patient states that symptoms began after awakening 3 mornings ago.  She states that pain has been constant since symptom onset.  She notes similar occurrences of pains after awakening in the past but the duration of symptoms brought her in the emergency department today.  She states she has taken at home Tylenol as well as 1 oxycodone with some relief of symptoms.  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  Denies any known trauma, shortness of breath, chest pain, nausea, dizziness, lightheadedness, sensory deficits or weakness in left arm, fever, chills, night sweats.  Patient states that she has been sleeping on her left side.  She notes pain is worsened with physical movement of left upper extremity but not with physical activity in general.    Past medical history significant for myocardial infarction, tobacco use, eczema  Home Medications Prior to Admission medications   Medication Sig Start Date End Date Taking? Authorizing Provider  cyclobenzaprine (FLEXERIL) 10 MG tablet Take 1 tablet (10 mg total) by mouth 2 (two) times daily as needed for muscle spasms. 05/08/22  Yes Sherian Maroon A, PA  lidocaine (LIDODERM) 5 % Place 1 patch onto the skin daily. Remove & Discard patch within 12 hours or as directed by MD 05/08/22  Yes Sherian Maroon A, PA  azithromycin (ZITHROMAX) 250 MG tablet TAKE 2 TABLETS BY MOUTH TODAY, THEN TAKE 1 TABLET DAILY FOR 4 DAYS 08/15/20   [provider]  clotrimazole-betamethasone (LOTRISONE) cream Apply 1 application topically 2 (two) times daily. 02/02/21    Candelaria Stagers, DPM  clotrimazole-betamethasone (LOTRISONE) cream Apply 1 application topically 2 (two) times daily. 03/25/21   Candelaria Stagers, DPM  clotrimazole-betamethasone (LOTRISONE) cream SMARTSIG:2 Topical Twice Daily 03/25/21   Candelaria Stagers, DPM  clotrimazole-betamethasone (LOTRISONE) cream Apply 1 application topically 2 (two) times daily. 04/13/21   Candelaria Stagers, DPM  hydrOXYzine (ATARAX/VISTARIL) 25 MG tablet Take 25 mg by mouth daily. 01/21/21   [provider]  ketoconazole (NIZORAL) 200 MG tablet Take 200 mg by mouth daily. 01/21/21   [provider]  methylPREDNISolone (MEDROL) 4 MG tablet TAKE 6 TABLETS ON DAY 1 AS DIRECTED ON PACKAGE AND DECREASE BY 1 TAB EACH DAY FOR A TOTAL OF 6 DAYS 08/15/20   [provider]  NYSTATIN powder SMARTSIG:2 Topical Twice Daily 01/21/21   [provider]  vitamin E 100 UNIT capsule Take 1 capsule (100 Units total) by mouth daily. 06/05/16   Joanna Puff, MD      Allergies    Patient has no known allergies.    Review of Systems   Review of Systems  All other systems reviewed and are negative.   Physical Exam Updated Vital Signs BP 134/73 (BP Location: Right Arm)   Pulse 73   Temp (!) 97.3 F (36.3 C) (Oral)   Resp 14   Ht 5' (1.524 m)   Wt 72.1 kg   LMP 12/28/2011   SpO2 95%   BMI 31.05 kg/m  Physical Exam Vitals and nursing note reviewed.  Constitutional:  General: She is not in acute distress.    Appearance: She is well-developed.  HENT:     Head: Normocephalic and atraumatic.  Eyes:     Conjunctiva/sclera: Conjunctivae normal.  Cardiovascular:     Rate and Rhythm: Normal rate and regular rhythm.     Heart sounds: No murmur heard. Pulmonary:     Effort: Pulmonary effort is normal. No respiratory distress.     Breath sounds: Normal breath sounds.  Abdominal:     Palpations: Abdomen is soft.     Tenderness: There is no abdominal tenderness.  Musculoskeletal:        General: No  swelling.     Cervical back: Normal range of motion and neck supple. No rigidity or tenderness.     Comments: No midline tenderness to palpation of cervical, thoracic or lumbar spine with no obvious step-off or deformity.  Patient has left paraspinal tenderness to palpation with associated muscular spasm as well as tenderness to palpation along left trapezial ridge.  No bony tenderness of left scapula, clavicle, humerus.  Patient has full active range of motion of bilateral upper extremities at shoulder, elbow, wrist, digits.  Radial pulses full and intact bilaterally.  Compartments soft and supple.  Patient complaining no sensory deficits along major nerve distributions of upper extremities.  Patient has full active range of motion of neck.  Lymphadenopathy:     Cervical: No cervical adenopathy.  Skin:    General: Skin is warm and dry.     Capillary Refill: Capillary refill takes less than 2 seconds.  Neurological:     Mental Status: She is alert.  Psychiatric:        Mood and Affect: Mood normal.     ED Results / Procedures / Treatments   Labs (all labs ordered are listed, but only abnormal results are displayed) Labs Reviewed - No data to display  EKG None  Radiology No results found.  Procedures Procedures    Medications Ordered in ED Medications  ketorolac (TORADOL) 15 MG/ML injection 15 mg (has no administration in time range)  lidocaine (LIDODERM) 5 % 1 patch (has no administration in time range)    ED Course/ Medical Decision Making/ A&P                           Medical Decision Making Risk Prescription drug management.   This patient presents to the ED for concern of left shoulder pain, this involves an extensive number of treatment options, and is a complaint that carries with it a high risk of complications and morbidity.  The differential diagnosis includes ACS, fracture, strain/sprain, muscular spasm osteoarthritis, septic arthritis, gout, compartment  syndrome, cervical radiculopathy, thoracic outlet syndrome   Co morbidities that complicate the patient evaluation  See HPI   Additional history obtained:  Additional history obtained from EMR External records from outside source obtained and reviewed including prior urgent care visit from 02/03/2022 for left shoulder numbness.   Lab Tests:  N/a   Imaging Studies ordered:  N/a   Cardiac Monitoring: / EKG:  The patient was maintained on a cardiac monitor.  I personally viewed and interpreted the cardiac monitored which showed an underlying rhythm of: Sinus rhythm   Consultations Obtained:  N/a   Problem List / ED Course / Critical interventions / Medication management  Left shoulder pain I ordered medication including Toradol and Lidoderm for pain   Reevaluation of the patient after these medicines showed that  the patient improved I have reviewed the patients home medicines and have made adjustments as needed   Social Determinants of Health:  Chronic cigarette use.  Denies illicit drug use   Test / Admission - Considered:  Left shoulder pain Vitals signs within normal range and stable throughout visit. Laboratory/imaging studies considered but deemed unnecessary Patient's symptoms likely secondary to muscular spasm/strain given area of tenderness as well as lack of concerning mechanism of injury and reassuring physical exam.  Patient has equal motor strength as well as neurovascularly intact.  Patient's symptoms do not sound cardiac/vascular related given persistence of symptoms over the past 3 days with no relation to physical activity besides movement of left arm.  No concern for fracture or dislocation given lack of traumatic mechanism as well as patient having full range of motion of left upper extremity joints.  Patient responded well to therapy provided while emergency department.  Recommend follow-up with physical therapy if patient symptoms continue.   Treatment plan was discussed at length with patient she acknowledged understanding was agreeable to said plan. Worrisome signs and symptoms were discussed with the patient, and the patient acknowledged understanding to return to the ED if noticed. Patient was stable upon discharge.          Final Clinical Impression(s) / ED Diagnoses Final diagnoses:  Acute pain of left shoulder    Rx / DC Orders ED Discharge Orders          Ordered    lidocaine (LIDODERM) 5 %  Every 24 hours        05/08/22 0951    cyclobenzaprine (FLEXERIL) 10 MG tablet  2 times daily PRN        05/08/22 0952              Peter Garter, PA 05/08/22 9211    Rondel Baton, MD 05/11/22 1056

## 2022-05-08 NOTE — ED Triage Notes (Signed)
Pt from home c/o of pain in her left shoulder for the past 3 days.

## 2022-05-08 NOTE — Discharge Instructions (Addendum)
Note the work-up today was overall consistent for muscular strain/spasm of the area we talked about.  We have treated this with a shot of Toradol as well as lidocaine patches here in the emergency department.  I will prescribe lidocaine as well as a muscle relaxer to use sparingly for your symptoms.  Please do not drive or operate machinery while taking the muscle laxer as it can cause drowsiness; it can also increase your fall risk as we discussed.  I recommend follow-up with physical therapy if your symptoms continue as well as attempting to stretching exercises we discussed.  Please do not hesitate to return to the emergency department if the worrisome signs and symptoms we discussed become apparent.

## 2022-07-17 NOTE — Progress Notes (Unsigned)
  Subjective:    Katie Stephens - 61 y.o. female MRN [id number]  Date of birth: May 31, 1961  HPI  Katie Stephens is a 31 year-old female to establish care.   Current issues and/or concerns: new pt/wnats to ck for diabetes,thyroid    ROS per HPI     Health Maintenance:  Health Maintenance Due  Topic Date Due   COVID-19 Vaccine (1) Never done   Hepatitis C Screening  Never done   COLONOSCOPY (Pts 45-73yrs Insurance coverage will need to be confirmed)  Never done   PAP SMEAR-Modifier  10/22/2009   Zoster Vaccines- Shingrix (1 of 2) Never done   INFLUENZA VACCINE  Never done     Past Medical History: Patient Active Problem List   Diagnosis Date Noted   Hot flashes 06/07/2016   Health care maintenance 06/06/2016   Possible exposure to STD 08/28/2014   BREAST MASS, LEFT 11/03/2009   TOBACCO USE 10/23/2006   MYOCARDIAL INFARCTION, HX OF 10/23/2006   MENORRHAGIA 10/23/2006   ECZEMA, ATOPIC DERMATITIS 10/18/2006      Social History   reports that she has quit smoking. Her smoking use included cigarettes. She does not have any smokeless tobacco history on file. She reports current alcohol use. She reports that she does not currently use drugs.   Family History  family history is not on file.   Medications: reviewed and updated   Objective:   Physical Exam LMP 12/28/2011  Physical Exam      Assessment & Plan:         Patient was given clear instructions to go to Emergency Department or return to medical center if symptoms don't improve, worsen, or new problems develop.The patient verbalized understanding.  I discussed the assessment and treatment plan with the patient. The patient was provided an opportunity to ask questions and all were answered. The patient agreed with the plan and demonstrated an understanding of the instructions.   The patient was advised to call back or seek an in-person evaluation if the symptoms worsen or if the condition fails to improve  as anticipated.    Ricky Stabs, NP 07/17/2022, 10:33 AM Primary Care at [facility]

## 2022-07-19 ENCOUNTER — Encounter: Payer: Commercial Managed Care - HMO | Admitting: Family

## 2022-07-19 DIAGNOSIS — Z131 Encounter for screening for diabetes mellitus: Secondary | ICD-10-CM

## 2022-07-19 DIAGNOSIS — Z1329 Encounter for screening for other suspected endocrine disorder: Secondary | ICD-10-CM

## 2022-07-19 DIAGNOSIS — Z7689 Persons encountering health services in other specified circumstances: Secondary | ICD-10-CM

## 2022-12-31 IMAGING — MG MM DIGITAL SCREENING BILAT W/ TOMO AND CAD
6 of 12 series · 6 of 36 positions shown · non-contrast
Comparison: Previous exam(s).

CLINICAL DATA: Screening.

EXAM:
DIGITAL SCREENING BILATERAL MAMMOGRAM WITH TOMOSYNTHESIS AND CAD
TECHNIQUE: Bilateral screening digital craniocaudal and mediolateral oblique
mammograms were obtained. Bilateral screening digital breast
tomosynthesis was performed. The images were evaluated with
computer-aided detection.

[R MLO synth-2D (1 of 2)]
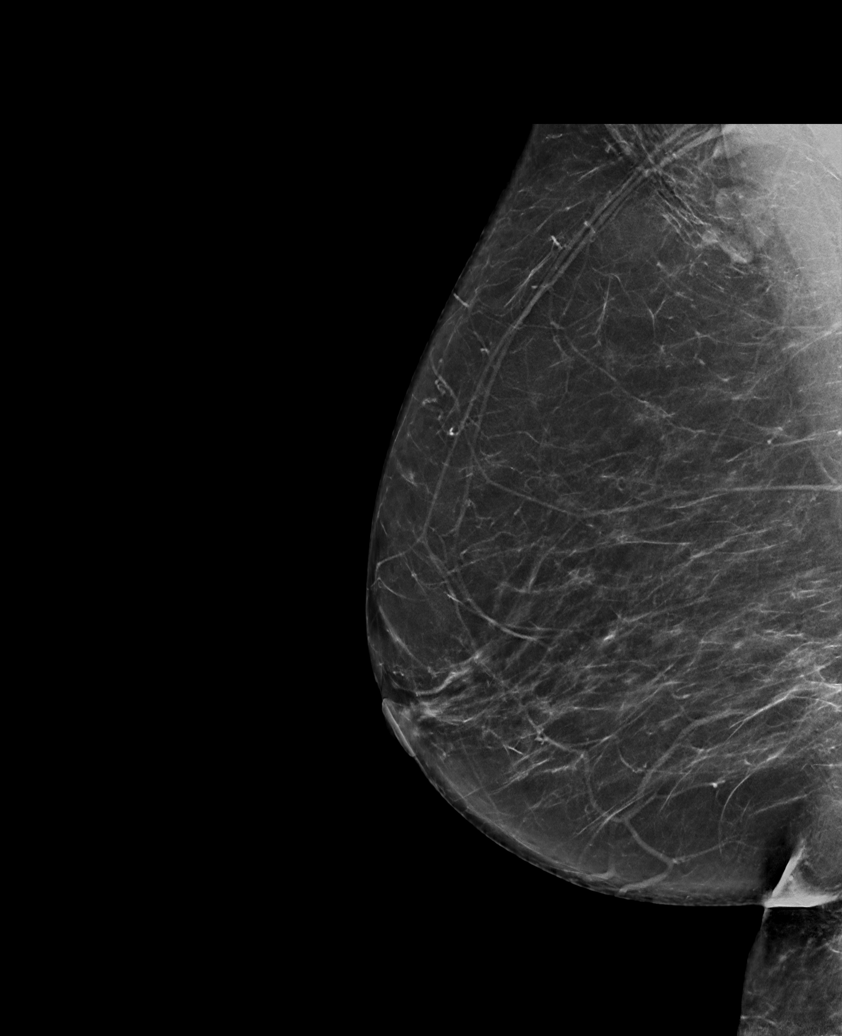

[L MLO synth-2D]
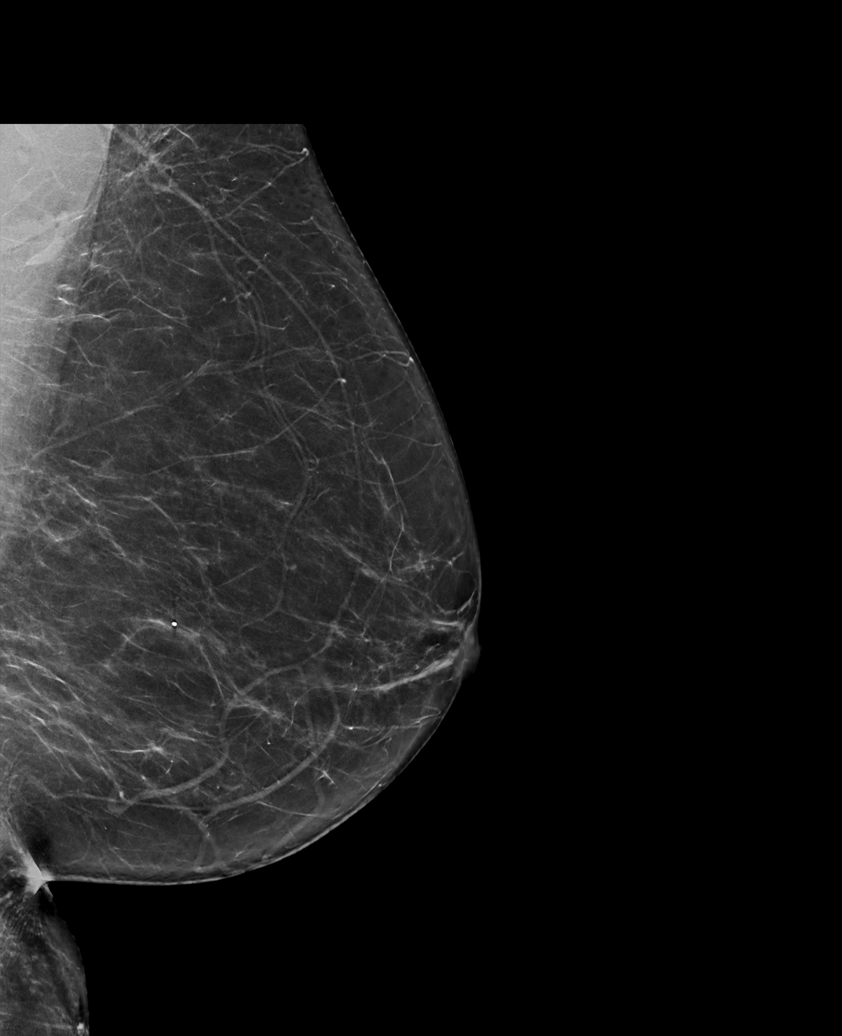

[R CC synth-2D]
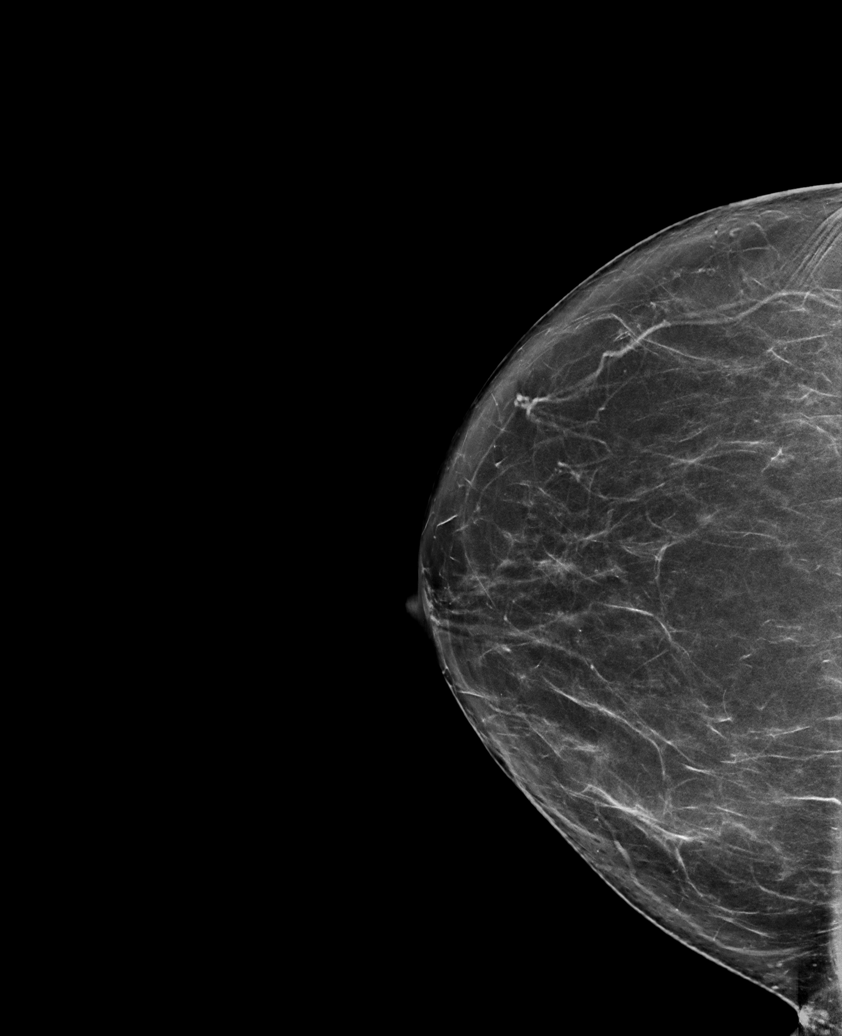

[R MLO synth-2D (2 of 2)]
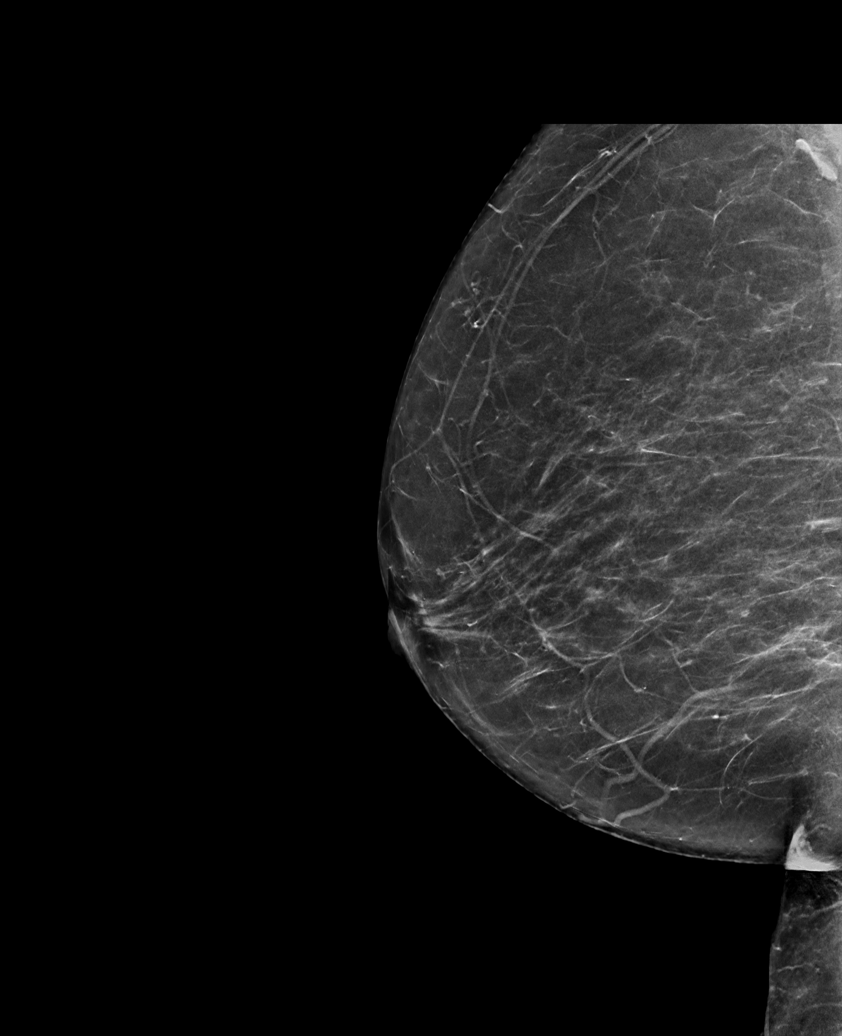

[L CC synth-2D]
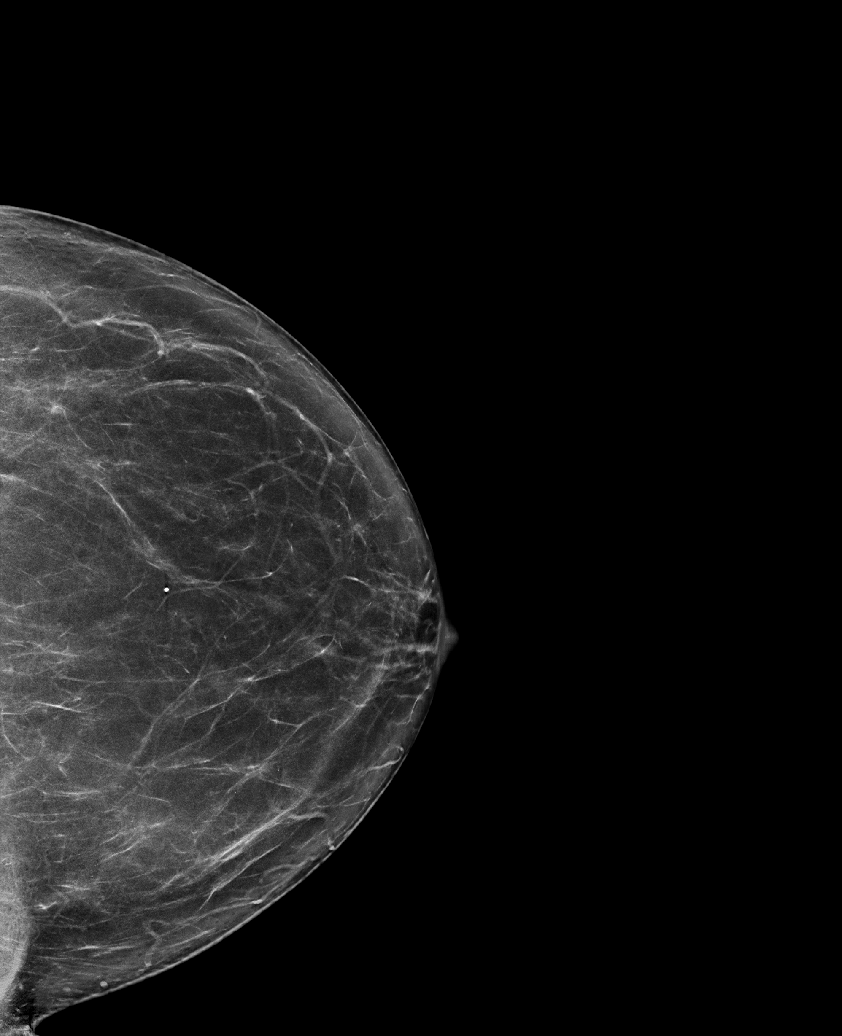

[R XCCL synth-2D]
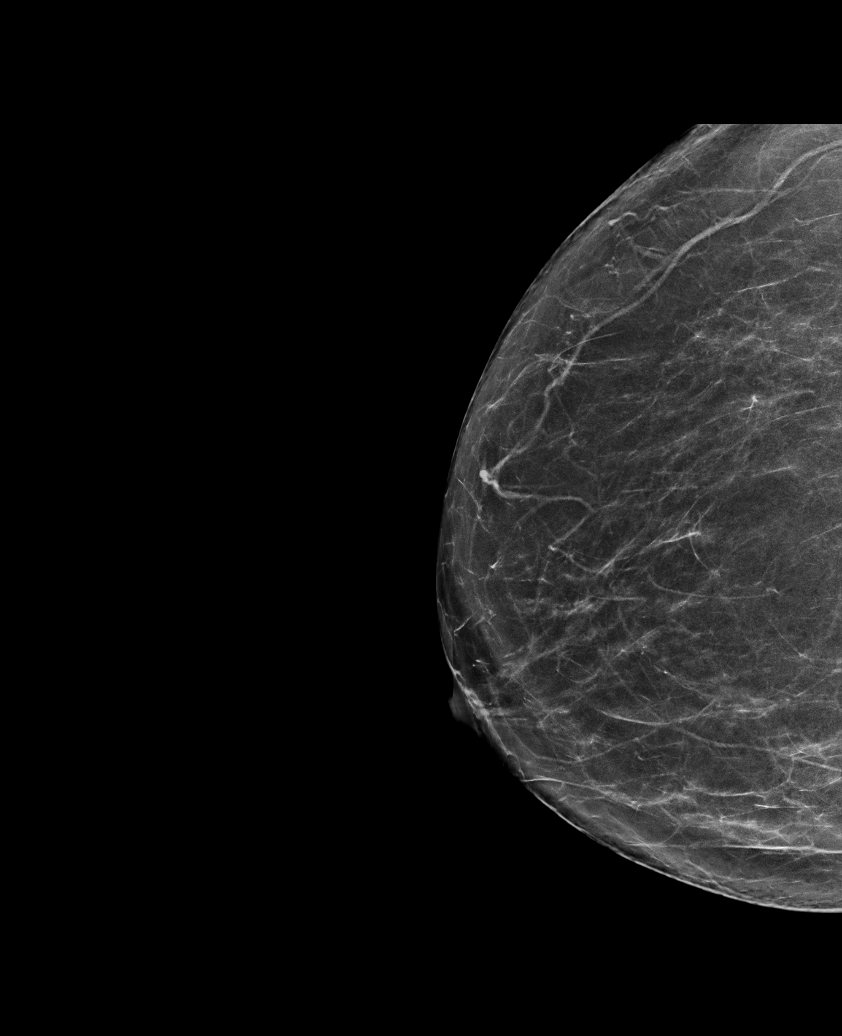

[6 of 36 positions shown; findings below may reference images not displayed]

ACR Breast Density Category b: There are scattered areas of
fibroglandular density.
FINDINGS: There are no findings suspicious for malignancy. The images were
evaluated with computer-aided detection.
IMPRESSION: No mammographic evidence of malignancy. A result letter of this
screening mammogram will be mailed directly to the patient.

RECOMMENDATION:
Screening mammogram in one year. (Code:[id number]-[id number])

BI-RADS CATEGORY  1: Negative.

## 2023-05-09 ENCOUNTER — Encounter (HOSPITAL_BASED_OUTPATIENT_CLINIC_OR_DEPARTMENT_OTHER): Payer: Self-pay | Admitting: Family Medicine

## 2023-05-09 ENCOUNTER — Ambulatory Visit (INDEPENDENT_AMBULATORY_CARE_PROVIDER_SITE_OTHER): Payer: Self-pay | Admitting: Family Medicine

## 2023-05-09 VITALS — BP 132/83 | HR 71 | Ht 60.0 in | Wt 167.8 lb

## 2023-05-09 DIAGNOSIS — Z7689 Persons encountering health services in other specified circumstances: Secondary | ICD-10-CM

## 2023-05-09 DIAGNOSIS — Z1231 Encounter for screening mammogram for malignant neoplasm of breast: Secondary | ICD-10-CM

## 2023-05-09 DIAGNOSIS — K6289 Other specified diseases of anus and rectum: Secondary | ICD-10-CM

## 2023-05-09 MED ORDER — HYDROCORTISONE ACETATE 25 MG RE SUPP: 25.0000 mg | Freq: Two times a day (BID) | RECTAL | 0 refills | Status: AC

## 2023-05-09 NOTE — Patient Instructions (Signed)
If you do not hear from GI in the next week, please reach out to our office.

## 2023-05-09 NOTE — Progress Notes (Signed)
New Patient Office Visit  Subjective:   Katie Stephens 1961-06-10 05/09/2023  Chief Complaint  Patient presents with   New Patient (Initial Visit)    Pt is here today to get established with the practice. States she feels like she has a hemorrhoid that she wants to have checked out.    HPI: Katie Stephens presents today to establish care at [facility] at [facility]. Introduced to Publishing rights manager role and practice setting.  All questions answered.   Last PCP: Pt states PCP several years ago  Last annual physical: N/a  Concerns: See below   Reports she is due for several screening exams. She has not had an annual exam in several years. She has an appt with OBGYN for her pap smear on 06/14/23 w/ Dr. Leanora Cover. She has a concern today of a possible hemorrhoid.   HEMORRHOIDS  Pt reports "pressure feeling at her rectum" when lying down at night. She reports feeling it for the past month. She cannot feel any palpable nodule or mass when palpating.  Anal fullness: yes Perianal itching/irritation: no Perianal pain: no Bright red rectal bleeding: no Amount of blood:  None Constipation: no Hard stools: no Chronic straining/valsava: no Treatments attempted: OTC medicated wipes and hydrocortisone cream  Previous hemorrhoids:  Yes, in pregnancy   Colonoscopy:  No, denies family hx of colon cancer.    The following portions of the patient's history were reviewed and updated as appropriate: past medical history, past surgical history, family history, social history, allergies, medications, and problem list.   Patient Active Problem List   Diagnosis Date Noted   Hot flashes 06/07/2016   Health care maintenance 06/06/2016   Possible exposure to STD 08/28/2014   BREAST MASS, LEFT 11/03/2009   TOBACCO USE 10/23/2006   MYOCARDIAL INFARCTION, HX OF 10/23/2006   MENORRHAGIA 10/23/2006   ECZEMA, ATOPIC DERMATITIS 10/18/2006   History reviewed. No  pertinent past medical history. History reviewed. No pertinent surgical history. History reviewed. No pertinent family history. Social History   Socioeconomic History   Marital status: Divorced    Spouse name: Not on file   Number of children: Not on file   Years of education: Not on file   Highest education level: Some college, no degree  Occupational History   Not on file  Tobacco Use   Smoking status: Former    Current packs/day: 0.00    Types: Cigarettes   Smokeless tobacco: Not on file  Substance and Sexual Activity   Alcohol use: Yes    Alcohol/week: 1.0 standard drink of alcohol    Types: 1 Glasses of wine per week    Comment: Socially   Drug use: Not Currently   Sexual activity: Yes    Birth control/protection: Condom  Other Topics Concern   Not on file  Social History Narrative   Not on file   Social Determinants of Health   Financial Resource Strain: Low Risk  (05/08/2023)   Overall Financial Resource Strain (CARDIA)    Difficulty of Paying Living Expenses: Not very hard  Food Insecurity: No Food Insecurity (05/08/2023)   Hunger Vital Sign    Worried About Running Out of Food in the Last Year: Never true    Ran Out of Food in the Last Year: Never true  Transportation Needs: No Transportation Needs (05/08/2023)   PRAPARE - Administrator, Civil Service (Medical): No    Lack of Transportation (Non-Medical): No  Physical Activity:  Sufficiently Active (05/08/2023)   Exercise Vital Sign    Days of Exercise per Week: 6 days    Minutes of Exercise per Session: 40 min  Stress: Stress Concern Present (05/08/2023)   [employer] of Occupational Health - Occupational Stress Questionnaire    Feeling of Stress : To some extent  Social Connections: Moderately Integrated (05/08/2023)   Social Connection and Isolation Panel [NHANES]    Frequency of Communication with Friends and Family: Three times a week    Frequency of Social Gatherings with Friends and  Family: Once a week    Attends Religious Services: More than 4 times per year    Active Member of [employer] or Organizations: Yes    Attends Engineer, structural: More than 4 times per year    Marital Status: Divorced  Intimate Partner Violence: Not At Risk (05/09/2023)   Humiliation, Afraid, Rape, and Kick questionnaire    Fear of Current or Ex-Partner: No    Emotionally Abused: No    Physically Abused: No    Sexually Abused: No   Outpatient Medications Prior to Visit  Medication Sig Dispense Refill   clotrimazole-betamethasone (LOTRISONE) cream Apply 1 application topically 2 (two) times daily. 30 g 6   azithromycin (ZITHROMAX) 250 MG tablet TAKE 2 TABLETS BY MOUTH TODAY, THEN TAKE 1 TABLET DAILY FOR 4 DAYS     clotrimazole-betamethasone (LOTRISONE) cream Apply 1 application topically 2 (two) times daily. 30 g 0   clotrimazole-betamethasone (LOTRISONE) cream Apply 1 application topically 2 (two) times daily. 45 g 3   clotrimazole-betamethasone (LOTRISONE) cream SMARTSIG:2 Topical Twice Daily 30 g 0   cyclobenzaprine (FLEXERIL) 10 MG tablet Take 1 tablet (10 mg total) by mouth 2 (two) times daily as needed for muscle spasms. 10 tablet 0   hydrOXYzine (ATARAX/VISTARIL) 25 MG tablet Take 25 mg by mouth daily.     ketoconazole (NIZORAL) 200 MG tablet Take 200 mg by mouth daily.     lidocaine (LIDODERM) 5 % Place 1 patch onto the skin daily. Remove & Discard patch within 12 hours or as directed by MD 30 patch 0   methylPREDNISolone (MEDROL) 4 MG tablet TAKE 6 TABLETS ON DAY 1 AS DIRECTED ON PACKAGE AND DECREASE BY 1 TAB EACH DAY FOR A TOTAL OF 6 DAYS     NYSTATIN powder SMARTSIG:2 Topical Twice Daily     vitamin E 100 UNIT capsule Take 1 capsule (100 Units total) by mouth daily. 90 capsule 1   No facility-administered medications prior to visit.   No Known Allergies  ROS: A complete ROS was performed with pertinent positives/negatives noted in the HPI. The remainder of the ROS are  negative.   Objective:   Today's Vitals   05/09/23 1009  BP: 132/83  Pulse: 71  SpO2: 98%  Weight: 167 lb 12.8 oz (76.1 kg)  Height: 5' (1.524 m)    GENERAL: Well-appearing, in NAD. Well nourished.  SKIN: Pink, warm and dry. No rash, lesion, ulceration, or ecchymoses.  Head: Normocephalic. NECK: Trachea midline. Full ROM w/o pain or tenderness.  EYES: Conjunctiva clear without exudates. EOMI, PERRL, no drainage present.  RESPIRATORY: Chest wall symmetrical. Respirations even and non-labored.  Rectal exam: no tenderness noted, external hemorrhoids noted, sphincter tone normal, stool guaiac negative, palpable enlargement of rectum present to right lower aspect internally. No palpable nodule or thrombosed hemorrhoid.  MSK: Muscle tone and strength appropriate for age.  EXTREMITIES: Without clubbing, cyanosis, or edema.  NEUROLOGIC: No motor or sensory deficits. Steady,  even gait. C2-C12 intact.  PSYCH/MENTAL STATUS: Alert, oriented x 3. Cooperative, appropriate mood and affect.   Chaperoned by Cristy Hilts, CMA.     Assessment & Plan:  1. Rectal nodule Discussed possibility of internal hemorrhoid versus rectal mass. Referral placed for evaluation and Cscope by [facility]. Pt declined family hx of colon cancer but upon chart review it is listed her father and sister had colon cancer. Will use Anusol HC suppositories to trial for relief. If no improvement, she will reach out to PCP.   - Ambulatory referral to Gastroenterology - hydrocortisone (ANUSOL-HC) 25 MG suppository; Place 1 suppository (25 mg total) rectally 2 (two) times daily.  Dispense: 12 suppository; Refill: 0  2. Breast cancer screening by mammogram [facility] ordered for [facility]. She will be called to scheduled.  - MM 3D SCREENING MAMMOGRAM BILATERAL BREAST; Future  3. Encounter to establish care with new doctor Discussed need for AE as she is several years past due and she is agreeable. Will obtain fasting labs  and return for AE. She would like to obtain her Shingrix and Flu next month. She is scheduled with OBGYN in October.   - Lipid panel; Future - Comprehensive metabolic panel; Future - CBC with Differential/Platelet; Future - Hemoglobin A1c; Future - TSH; Future   Patient to reach out to office if new, worrisome, or unresolved symptoms arise or if no improvement in patient's condition. Patient verbalized understanding and is agreeable to treatment plan. All questions answered to patient's satisfaction.    Return in about 6 weeks (around 06/20/2023) for ANNUAL PHYSICAL (fasting labs prior) .   Of note, portions of this note may have been created with voice recognition software Physicist, medical). While this note has been edited for accuracy, occasional wrong-word or 'sound-a-like' substitutions may have occurred due to the inherent limitations of voice recognition software.  Yolanda Manges, FNP

## 2023-05-10 ENCOUNTER — Telehealth (HOSPITAL_BASED_OUTPATIENT_CLINIC_OR_DEPARTMENT_OTHER): Payer: Self-pay | Admitting: Family Medicine

## 2023-05-10 NOTE — Telephone Encounter (Signed)
Supository need PA. Please get PA for them per the patient

## 2023-05-10 NOTE — Telephone Encounter (Signed)
Called pt's pharmacy about the call received from pt and was told by the pharmacy staff that the medication that was prescribed is avail OTC and should not need a prior auth.  Called and spoke with pt letting her know the info I was told by the pharmacy staff and she again told me that they told her that it did need a PA. Told pt to contact the pharmacy again and told her that if the med does need a PA that once it was sent to [place] we would get it done and she verbalized understanding.

## 2023-05-15 NOTE — Telephone Encounter (Signed)
Prior authorization initiated for the [facility] suppositories. Prior authorization was denied.  Per Jon Gills, have patient take OTC suppositories.  Attempted to call pt but unable to reach. Left message for her to return call.

## 2023-05-25 ENCOUNTER — Other Ambulatory Visit: Payer: Self-pay | Admitting: Family Medicine

## 2023-05-25 DIAGNOSIS — Z1211 Encounter for screening for malignant neoplasm of colon: Secondary | ICD-10-CM

## 2023-05-25 DIAGNOSIS — Z1212 Encounter for screening for malignant neoplasm of rectum: Secondary | ICD-10-CM

## 2023-06-13 ENCOUNTER — Other Ambulatory Visit (HOSPITAL_BASED_OUTPATIENT_CLINIC_OR_DEPARTMENT_OTHER): Payer: BLUE CROSS/BLUE SHIELD

## 2023-06-14 ENCOUNTER — Ambulatory Visit: Payer: Self-pay | Admitting: Obstetrics and Gynecology

## 2023-06-20 ENCOUNTER — Encounter (HOSPITAL_BASED_OUTPATIENT_CLINIC_OR_DEPARTMENT_OTHER): Payer: BLUE CROSS/BLUE SHIELD | Admitting: Family Medicine

## 2023-06-23 LAB — COLOGUARD

## 2023-06-25 NOTE — Progress Notes (Signed)
Hi [facility],  I saw that you were not able to complete your cologuard testing kit. The company should be reaching out to discuss repeat testing. If you would instead like to do a colonoscopy please let us know and we can place a referral.

## 2023-07-24 ENCOUNTER — Encounter (HOSPITAL_BASED_OUTPATIENT_CLINIC_OR_DEPARTMENT_OTHER): Payer: Self-pay | Admitting: Family Medicine

## 2023-09-10 ENCOUNTER — Encounter (HOSPITAL_BASED_OUTPATIENT_CLINIC_OR_DEPARTMENT_OTHER): Payer: Self-pay | Admitting: *Deleted

## 2024-01-03 IMAGING — DX DG CHEST 2V
2 series · 2 of 2 positions shown · non-contrast
Comparison: None Available.

CLINICAL DATA: Radiating numbness down arm

EXAM:
CHEST - 2 VIEW

[chest pa]
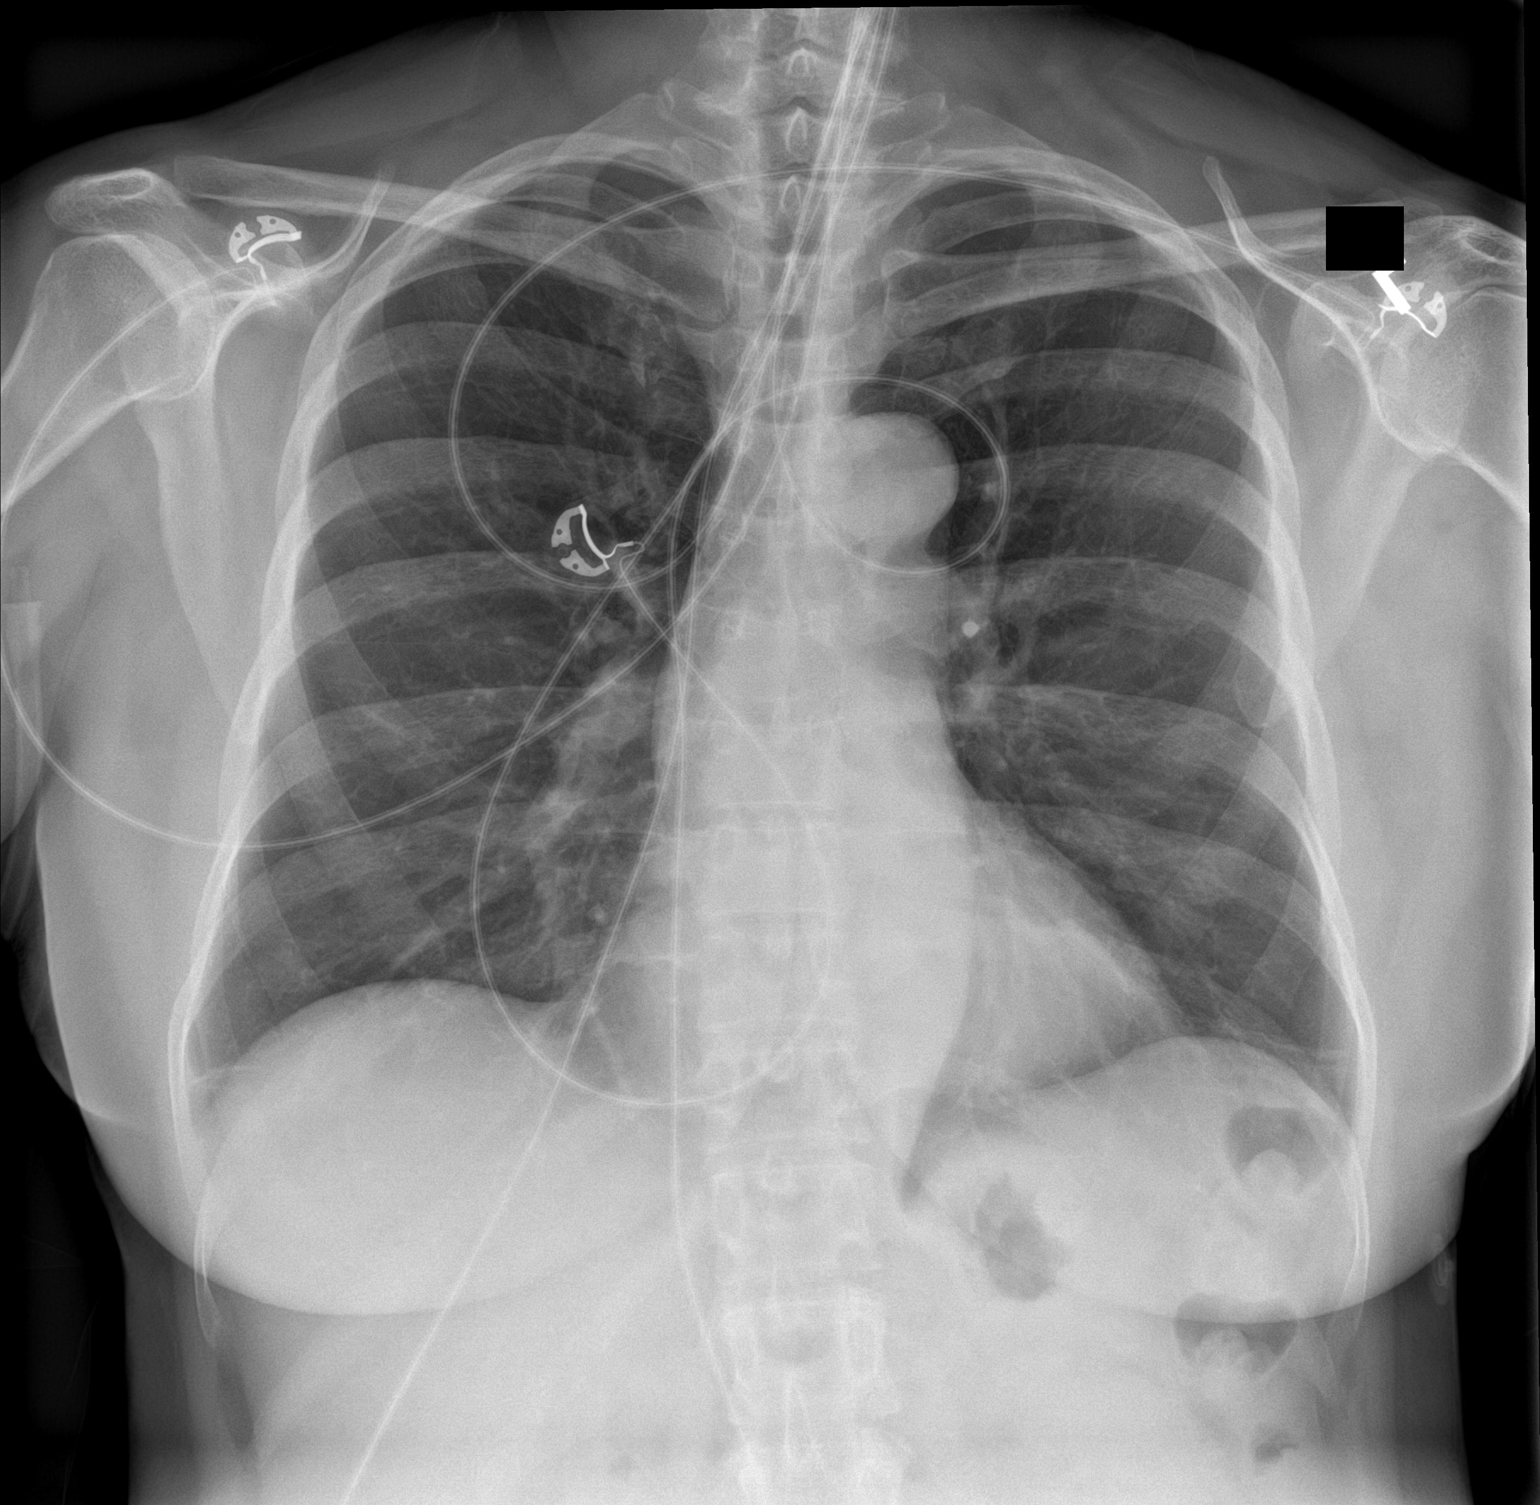

[chest lat]
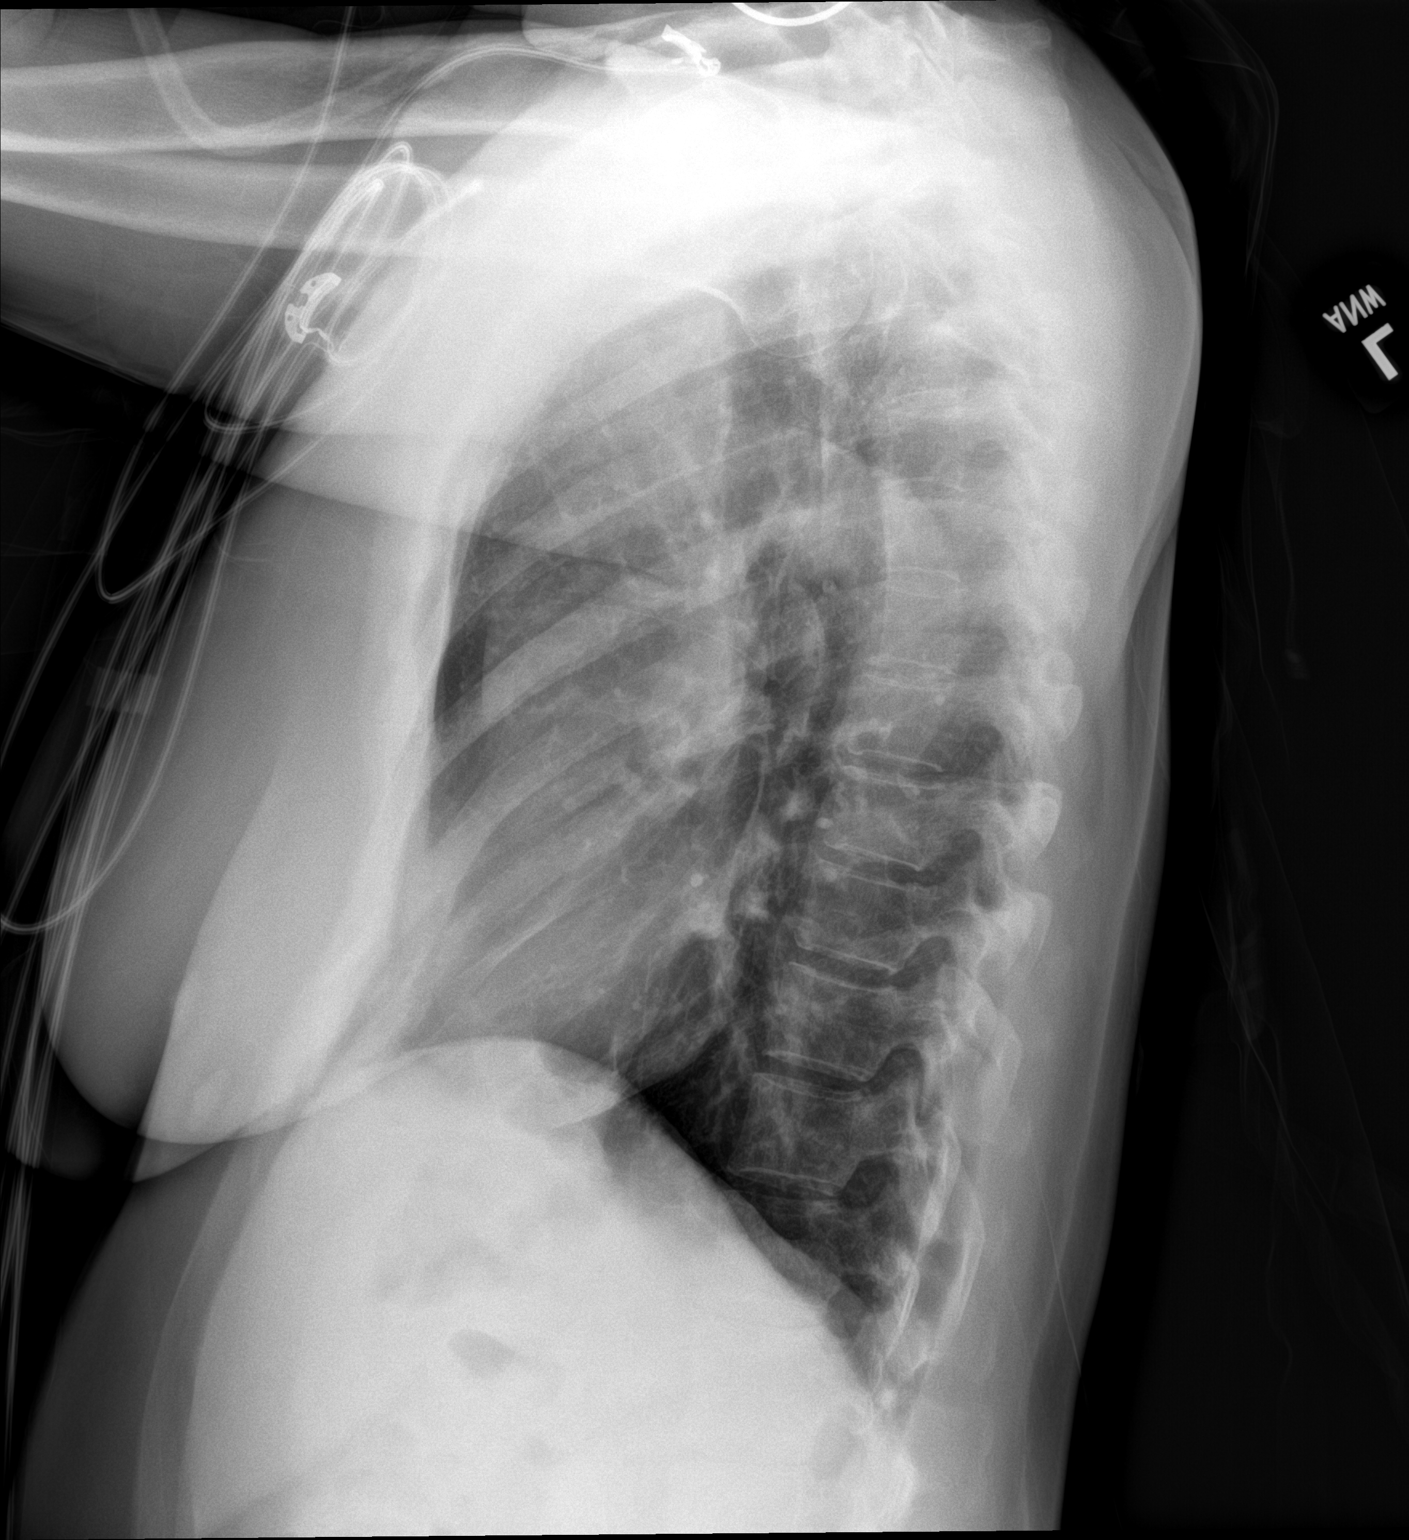

[2 of 2 positions shown; findings below may reference images not displayed]

FINDINGS: The heart size and mediastinal contours are within normal limits.
Both lungs are clear. The visualized skeletal structures are
unremarkable.
IMPRESSION: No active cardiopulmonary disease.
